# Patient Record
Sex: Male | Born: 1960 | Race: White | Hispanic: No | Marital: Married | Smoking: Current every day smoker
Health system: Southern US, Community
[De-identification: ages and names within clinical notes are randomized; demographics above are authoritative.]

## PROBLEM LIST (undated history)

## (undated) DIAGNOSIS — I2510 Atherosclerotic heart disease of native coronary artery without angina pectoris: Secondary | ICD-10-CM

## (undated) DIAGNOSIS — F32A Depression, unspecified: Secondary | ICD-10-CM

## (undated) DIAGNOSIS — K449 Diaphragmatic hernia without obstruction or gangrene: Secondary | ICD-10-CM

## (undated) DIAGNOSIS — F411 Generalized anxiety disorder: Secondary | ICD-10-CM

## (undated) DIAGNOSIS — E785 Hyperlipidemia, unspecified: Secondary | ICD-10-CM

## (undated) DIAGNOSIS — G4730 Sleep apnea, unspecified: Secondary | ICD-10-CM

## (undated) DIAGNOSIS — M1990 Unspecified osteoarthritis, unspecified site: Secondary | ICD-10-CM

## (undated) DIAGNOSIS — F329 Major depressive disorder, single episode, unspecified: Secondary | ICD-10-CM

## (undated) DIAGNOSIS — K219 Gastro-esophageal reflux disease without esophagitis: Secondary | ICD-10-CM

## (undated) HISTORY — DX: Hyperlipidemia, unspecified: E78.5

## (undated) HISTORY — PX: KNEE SURGERY: SHX244

## (undated) HISTORY — PX: COLONOSCOPY: SHX174

## (undated) HISTORY — DX: Depression, unspecified: F32.A

## (undated) HISTORY — DX: Major depressive disorder, single episode, unspecified: F32.9

## (undated) HISTORY — PX: MOUTH SURGERY: SHX715

## (undated) HISTORY — DX: Unspecified osteoarthritis, unspecified site: M19.90

## (undated) HISTORY — DX: Generalized anxiety disorder: F41.1

## (undated) HISTORY — DX: Sleep apnea, unspecified: G47.30

## (undated) HISTORY — DX: Gastro-esophageal reflux disease without esophagitis: K21.9

---

## 1998-04-09 ENCOUNTER — Ambulatory Visit (HOSPITAL_COMMUNITY): Admission: RE | Admit: 1998-04-09 | Discharge: 1998-04-09 | Payer: Self-pay | Admitting: Rheumatology

## 1998-05-31 ENCOUNTER — Ambulatory Visit (HOSPITAL_COMMUNITY): Admission: RE | Admit: 1998-05-31 | Discharge: 1998-05-31 | Payer: Self-pay | Admitting: Gastroenterology

## 1998-07-12 ENCOUNTER — Ambulatory Visit (HOSPITAL_COMMUNITY): Admission: RE | Admit: 1998-07-12 | Discharge: 1998-07-12 | Payer: Self-pay | Admitting: Gastroenterology

## 1998-10-16 ENCOUNTER — Encounter: Payer: Self-pay | Admitting: Gastroenterology

## 1998-10-16 ENCOUNTER — Ambulatory Visit (HOSPITAL_COMMUNITY): Admission: RE | Admit: 1998-10-16 | Discharge: 1998-10-16 | Payer: Self-pay | Admitting: Gastroenterology

## 1998-11-02 ENCOUNTER — Ambulatory Visit: Admission: RE | Admit: 1998-11-02 | Discharge: 1998-11-02 | Payer: Self-pay | Admitting: Gastroenterology

## 1998-11-02 ENCOUNTER — Encounter: Payer: Self-pay | Admitting: Gastroenterology

## 1999-09-13 ENCOUNTER — Emergency Department (HOSPITAL_COMMUNITY): Admission: EM | Admit: 1999-09-13 | Discharge: 1999-09-13 | Payer: Self-pay | Admitting: *Deleted

## 2007-02-09 ENCOUNTER — Ambulatory Visit (HOSPITAL_COMMUNITY): Admission: RE | Admit: 2007-02-09 | Discharge: 2007-02-09 | Payer: Self-pay | Admitting: Family Medicine

## 2008-08-04 ENCOUNTER — Emergency Department (HOSPITAL_COMMUNITY): Admission: EM | Admit: 2008-08-04 | Discharge: 2008-08-04 | Payer: Self-pay | Admitting: Emergency Medicine

## 2011-05-08 ENCOUNTER — Observation Stay (HOSPITAL_COMMUNITY)
Admission: EM | Admit: 2011-05-08 | Discharge: 2011-05-09 | Disposition: A | Discharge status: Home / Self Care | Payer: Self-pay | Attending: Emergency Medicine | Admitting: Emergency Medicine

## 2011-05-08 ENCOUNTER — Emergency Department (HOSPITAL_COMMUNITY): Payer: Self-pay

## 2011-05-08 DIAGNOSIS — R0989 Other specified symptoms and signs involving the circulatory and respiratory systems: Secondary | ICD-10-CM | POA: Insufficient documentation

## 2011-05-08 DIAGNOSIS — F17200 Nicotine dependence, unspecified, uncomplicated: Secondary | ICD-10-CM | POA: Insufficient documentation

## 2011-05-08 DIAGNOSIS — R0602 Shortness of breath: Secondary | ICD-10-CM | POA: Insufficient documentation

## 2011-05-08 DIAGNOSIS — R0609 Other forms of dyspnea: Secondary | ICD-10-CM | POA: Insufficient documentation

## 2011-05-08 DIAGNOSIS — R079 Chest pain, unspecified: Secondary | ICD-10-CM

## 2011-05-08 DIAGNOSIS — K449 Diaphragmatic hernia without obstruction or gangrene: Secondary | ICD-10-CM | POA: Insufficient documentation

## 2011-05-08 DIAGNOSIS — R110 Nausea: Secondary | ICD-10-CM | POA: Insufficient documentation

## 2011-05-08 DIAGNOSIS — I2510 Atherosclerotic heart disease of native coronary artery without angina pectoris: Secondary | ICD-10-CM | POA: Insufficient documentation

## 2011-05-08 HISTORY — DX: Diaphragmatic hernia without obstruction or gangrene: K44.9

## 2011-05-08 LAB — DIFFERENTIAL
Basophils Absolute: 0.1 10*3/uL (ref 0.0–0.1)
Basophils Relative: 1 % (ref 0–1)
Eosinophils Absolute: 0.1 10*3/uL (ref 0.0–0.7)
Eosinophils Relative: 2 % (ref 0–5)
Lymphocytes Relative: 21 % (ref 12–46)
Lymphs Abs: 1.4 10*3/uL (ref 0.7–4.0)
Monocytes Absolute: 0.2 10*3/uL (ref 0.1–1.0)
Monocytes Relative: 4 % (ref 3–12)
Neutro Abs: 4.8 10*3/uL (ref 1.7–7.7)
Neutrophils Relative %: 73 % (ref 43–77)

## 2011-05-08 LAB — CK TOTAL AND CKMB (NOT AT ARMC)
CK, MB: 1.3 ng/mL (ref 0.3–4.0)
Total CK: 56 U/L (ref 7–232)

## 2011-05-08 LAB — CBC
HCT: 41.5 % (ref 39.0–52.0)
Hemoglobin: 14.9 g/dL (ref 13.0–17.0)
WBC: 6.7 10*3/uL (ref 4.0–10.5)

## 2011-05-08 LAB — BASIC METABOLIC PANEL
Calcium: 10.9 mg/dL — ABNORMAL HIGH (ref 8.4–10.5)
GFR calc Af Amer: >60 mL/min (ref >60)
GFR calc non Af Amer: >60 mL/min (ref >60)
Sodium: 137 mEq/L (ref 135–145)

## 2011-05-08 LAB — POCT CARDIAC MARKERS
CKMB, poc: <1 ng/mL — ABNORMAL LOW (ref 1.0–8.0)
Myoglobin, poc: 84.9 ng/mL (ref 12–200)

## 2011-05-09 ENCOUNTER — Encounter (HOSPITAL_COMMUNITY): Payer: Self-pay | Admitting: Radiology

## 2011-05-09 ENCOUNTER — Observation Stay (HOSPITAL_COMMUNITY): Payer: Self-pay

## 2011-05-09 MED ORDER — IOHEXOL 350 MG/ML SOLN
80.0000 mL | Freq: Once | INTRAVENOUS | Status: AC | PRN
  Administered 2011-05-09: 80 mL via INTRAVENOUS

## 2011-05-11 ENCOUNTER — Telehealth: Payer: Self-pay | Admitting: Physician Assistant

## 2011-05-11 NOTE — H&P (Signed)
NAMEMARIUSZ, [name] NO.:  [id number]  MEDICAL RECORD NO.:  [id number]           PATIENT TYPE:  O  LOCATION:  1864                         FACILITY:  [facility]  PHYSICIAN:  Verne Carrow, MDDATE OF BIRTH:  Aug 29, 1961  DATE OF ADMISSION:  05/08/2011 DATE OF DISCHARGE:                             HISTORY & PHYSICAL   REASON FOR ADMISSION:  Chest pain.  HISTORY OF PRESENT ILLNESS:  Mr. [name] is a pleasant 50 year old Caucasian male with a history of tobacco abuse, who presented to the emergency department at the advice of his primary care physician after 4 days of dull chest pain, nausea, and a sensation of heaviness in his head and extremities.  Yesterday morning, the patient noted a worsening of the pain as well as blurry vision and had an episode of nausea with emesis.  He does describe dyspnea on exertion for 1 year.  He was seen by his primary care doctor and was noted to have a high blood pressure and was advised to go to the emergency department.  He has had no further episodes of chest pain while here in the emergency department. The patient has been watched in the clinical decision unit.  The patient underwent a coronary CT scan earlier today which showed the possibility of calcified plaque in all 3 of his major epicardial coronary arteries. At this time, the patient has no complaints.  PAST MEDICAL HISTORY: 1. Ongoing tobacco abuse. 2. Hiatal hernia.  PAST SURGICAL HISTORY:  Knee surgery.  ALLERGIES:  No known drug allergies.  HOME MEDICATIONS:  None.  SOCIAL HISTORY:  The patient lives in [place] with his wife.  He is married with 2 adult children.  He is employed as a Gaffer.  He denies use of alcohol or illicit drugs.  He has smoked approximately 40 pack years.  He currently continues to smoke.  FAMILY HISTORY:  The patient's mother has had a leaky valve, however, this is not further specified.  His father died at age 51 from  a myocardial infarction.  REVIEW OF SYSTEMS:  As stated in the history of present illness, is otherwise negative.  PHYSICAL EXAMINATION:  VITAL SIGNS:  Temperature 99.8, pulse 76 and regular, respirations 18 and unlabored, blood pressure 149/90, he is sating 98% on room air. GENERAL:  He is a pleasant, well-nourished, middle-aged Caucasian male in no acute distress.  He is alert and oriented x3. HEENT:  Normal. NECK:  No JVD.  No carotid bruits.  No thyromegaly.  No lymphadenopathy. SKIN:  Warm and dry. MUSCULOSKELETAL:  Moves all extremities equally. NEUROLOGICAL:  No focal neurological deficits. PSYCHIATRIC:  Mood and affect are appropriate. CARDIOVASCULAR:  Regular rate and rhythm without murmurs, gallops, or rubs noted. ABDOMEN:  Soft, nontender.  Bowel sounds are present. LUNGS:  Clear to auscultation bilaterally without wheezes, rhonchi, or crackles noted. EXTREMITIES:  No evidence of edema.  Pulses are 2+ in all extremities.  DIAGNOSTIC STUDIES: 1. A 12-lead EKG shows normal sinus rhythm with no ST-segment changes. 2. Cardiac enzymes have been negative x3. 3. Hemoglobin 14.9, creatinine 0.93. 4. Coronary CTA shows calcified plaque in the  left anterior descending     artery, diagonal branches of the LAD, circumflex artery and the     right coronary artery.  ASSESSMENT AND PLAN:  This is a pleasant 50 year old Caucasian male who was admitted to the hospital with chest pain, dyspnea, nausea, and vomiting.  He is ruled out for myocardial infarction with serial cardiac enzymes.  There are no ischemic changes on his EKG.  He has been watched here in the clinical decision unit and a cardiac CT this afternoon suggested the possibility of calcified stenoses in the major epicardial coronary arteries.  I have discussed the potential for obstructive coronary artery disease with this patient and his family.  He agrees to proceed with the diagnostic left heart catheterization today.   All risks and benefits of the procedure have been reviewed.  We will plan his cath in the main cath lab this afternoon.  He will be given 4 additional baby aspirin today.     Verne Carrow, MD     CM/MEDQ  D:  05/09/2011  T:  05/10/2011  Job:  [id number]  Electronically Signed by Verne Carrow MD on 05/11/2011 09:57:52 PM

## 2011-05-11 NOTE — Telephone Encounter (Signed)
Patient seen in ED on Friday 5/18 with chest pain.  CT of chest notable for coronary calcifications, esp in the LAD territory. He was to have a cath, but it was postponed and he was sent home with plans to do his cath tomorrow. He now calls with "panic attacks" and wants to know if he can take his mother's lorazepam. He is waking with chest pressure and dyspnea.  He notes radiation up to his jaw.  He can belch and the symptoms improve. With his CT demonstrating evidence for CAD and his symptoms, I have recommended he go to the ED at [facility] for admission with plans to do his cath tomorrow as scheduled.

## 2011-05-12 ENCOUNTER — Ambulatory Visit (HOSPITAL_COMMUNITY)
Admission: RE | Admit: 2011-05-12 | Discharge: 2011-05-12 | Disposition: A | Discharge status: Home / Self Care | Payer: Self-pay | Source: Ambulatory Visit | Attending: Cardiovascular Disease | Admitting: Cardiovascular Disease

## 2011-05-12 DIAGNOSIS — R0789 Other chest pain: Secondary | ICD-10-CM | POA: Insufficient documentation

## 2011-05-12 DIAGNOSIS — I2510 Atherosclerotic heart disease of native coronary artery without angina pectoris: Secondary | ICD-10-CM | POA: Insufficient documentation

## 2011-05-12 LAB — PROTIME-INR: INR: 0.95 (ref 0.00–1.49)

## 2011-05-12 LAB — BASIC METABOLIC PANEL
Chloride: 101 mEq/L (ref 96–112)
Creatinine, Ser: 0.9 mg/dL (ref 0.4–1.5)
GFR calc Af Amer: >60 mL/min (ref >60)
Potassium: 3.9 mEq/L (ref 3.5–5.1)

## 2011-05-12 NOTE — Telephone Encounter (Signed)
Agree. cdm 

## 2011-05-13 ENCOUNTER — Telehealth: Payer: Self-pay | Admitting: Cardiovascular Disease

## 2011-05-13 DIAGNOSIS — K449 Diaphragmatic hernia without obstruction or gangrene: Secondary | ICD-10-CM

## 2011-05-13 NOTE — Telephone Encounter (Signed)
Patient was discharged yesterday after his cardiac catheterization. His cath was clean and Dr. Clifton James does not believe this is CAD. He has been having severe anxiety attacks along with SOB for the past few weeks due to his hiata hernia and would like to be seen by a gastroenterologist ASAP. He hasn't been able to eat for the past few weeks without getting SOB and has been spitting up a yellow substance as well. We will refer him to see [facility] as soon as possible.

## 2011-05-13 NOTE — Telephone Encounter (Signed)
Pt had cath done on yesterday.  C./ O pressure on chest. Sob. Severe ingestion.  H/O hiatal  hernia.

## 2011-05-13 NOTE — Cardiovascular Report (Signed)
[name], [name] NO.:  [id number]  MEDICAL RECORD NO.:  [id number]           PATIENT TYPE:  O  LOCATION:  [facility]                         FACILITY:  [facility]  PHYSICIAN:  Verne Carrow, MDDATE OF BIRTH:  08/30/1961  DATE OF PROCEDURE:  05/12/2011 DATE OF DISCHARGE:                           CARDIAC CATHETERIZATION   PROCEDURE PERFORMED: 1. Left heart catheterization 2. Selective coronary angiography. 3. Left ventricular angiogram.  OPERATOR:  Verne Carrow, MD  INDICATIONS:  This is a 50 year old Caucasian male with a history of ongoing tobacco abuse who presented to the emergency department last week with complaints of chest discomfort.  He underwent a coronary CT scan which showed calcification in his left anterior descending artery and circumflex artery with moderate stenoses.  The patient's chest pain was somewhat atypical, however, diagnostic catheterization was arranged given the findings on the coronary CT scan.  We had originally planned on performing his catheterization on Friday, May 18, however, due to scheduling issues, we arranged it for today.  PROCEDURE IN DETAIL:  The patient was brought to the main cardiac catheterization laboratory as an outpatient.  Informed consent was signed prior to the procedure.  The patient was placed supine on the cath table.  An Allen's test was performed on the right wrist and was positive.  The right wrist was then prepped and draped in sterile fashion.  A 1% lidocaine was used for local anesthesia.  A 5-French sheath was inserted without difficulty into the right radial artery.  A 3 mg of verapamil was given through the sheath.  A 4000 units of intravenous heparin was given after sheath insertion.  Initially we used a JL-3.5 catheter to engage the left system.  This catheter was selectively engaged into the circumflex artery.  We performed angiography of the circumflex artery, then exchanged the  catheter out for a TIG catheters.  We were then able to cannulate the left main appropriately and visualize both the left anterior descending artery and the circumflex artery with these injections.  A JR-4 catheter was used to selectively engage and inject the small nondominant right coronary artery.  A pigtail catheter was used to perform a left ventricular angiogram.  The patient tolerated the procedure well without any complications.  The patient had the sheath removed here in the cath lab and a Terumo hemostasis band was applied over the arteriotomy site.  The patient was taken to the recovery area in stable condition.  HEMODYNAMIC FINDINGS:  Central aortic pressure 120/73.  Left ventricular pressure 129/0/15.  ANGIOGRAPHIC FINDINGS: 1. The left main coronary artery had no evidence of disease. 2. The left anterior descending was a moderate-sized vessel that     coursed to the apex.  The proximal vessel had mild calcification     with mild 20% plaque.  The midvessel had 20% plaque.  The midvessel     had one area with a tubular 40% stenosis.  None of these lesions     appeared to be flow-limiting.  There were too small to moderate-     sized diagonal branches that had mild plaque disease. 3. The circumflex  artery gave off 2 early small-caliber obtuse     marginal branches and a large bifurcating third obtuse marginal     branch.  There was mild plaque disease in this vessel.  The third     obtuse marginal branch had a 40% stenosis that did not appear to be     flow-limiting. 4. The right coronary artery was a small nondominant vessel with no     evidence of disease. 5. Left ventricular angiogram was performed in the RAO projection     showed normal left ventricular systolic function with ejection     fraction of 55-60%.  IMPRESSION: 1. Moderate nonobstructive coronary artery disease. 2. Normal left ventricular systolic function. 3. Noncardiac chest pain, most likely secondary  to GI source.  RECOMMENDATIONS:  I do not feel this patient's moderate nonobstructive coronary artery disease is causing his chest pain.  His pain occurs most commonly after meals.  I will start him on a proton pump inhibitor as well as an aspirin and statin.  We will discharge him home today and will plan followup in 2-3 weeks in my office.  At the office visit, we will discuss referral to a gastroenterologist for upper endoscopy.  The patient does have a history of hiatal hernia.     Verne Carrow, MD     CM/MEDQ  D:  05/12/2011  T:  05/12/2011  Job:  [id number]  Electronically Signed by Verne Carrow MD on 05/13/2011 08:38:13 AM

## 2011-05-15 ENCOUNTER — Encounter: Payer: Self-pay | Admitting: Gastroenterology

## 2011-05-15 ENCOUNTER — Ambulatory Visit (INDEPENDENT_AMBULATORY_CARE_PROVIDER_SITE_OTHER): Payer: Self-pay | Admitting: Gastroenterology

## 2011-05-15 DIAGNOSIS — F419 Anxiety disorder, unspecified: Secondary | ICD-10-CM | POA: Insufficient documentation

## 2011-05-15 DIAGNOSIS — R079 Chest pain, unspecified: Secondary | ICD-10-CM

## 2011-05-15 DIAGNOSIS — F411 Generalized anxiety disorder: Secondary | ICD-10-CM

## 2011-05-15 DIAGNOSIS — K449 Diaphragmatic hernia without obstruction or gangrene: Secondary | ICD-10-CM

## 2011-05-15 DIAGNOSIS — R1310 Dysphagia, unspecified: Secondary | ICD-10-CM

## 2011-05-15 MED ORDER — ALPRAZOLAM 1 MG PO TABS: ORAL_TABLET | ORAL | Status: DC

## 2011-05-15 NOTE — Assessment & Plan Note (Signed)
Symptoms are very suggestive of a large hilar hernia that may reside mostly in his chest. An esophageal motility disorder or fixed esophageal stricture are other possibilities.  Recommendations #1 upper GI series

## 2011-05-15 NOTE — Patient Instructions (Addendum)
You have been scheduled for a Upper GI Series at [facility]. Please arrive on 05/21/11 at 845 am Nothing to eat or drink after midnight.

## 2011-05-15 NOTE — Assessment & Plan Note (Addendum)
Per the patient's request I will prescribe an anxieolytic

## 2011-05-15 NOTE — Progress Notes (Signed)
History of Present Illness:  [name] is a pleasant 50 year old white male referred at the request of Dr. Garner Nash for evaluation of dysphagia. Recently he has had problems consisting of severe fullness postprandially in the chest. He has had to vomit to relieve this pressure. It is not quite clear to me whether he is having dysphagia, per se. An attempt at upper endoscopy several years ago was unsuccessful due to poor sedation. He's lost about 10 pounds in the past few months. He believes his symptoms are worsening. With this fullness he may develop a panic attack. He has frequent pyrosis. Upper GI series in 1999 demonstrated a sliding hiatal hernia. Cardiac catheterization last week demonstrated moderate coronary artery disease.   Review of Systems: He has occasional discomfort over the left flank. He denies dysuria. Pertinent positive and negative review of systems were noted in the above HPI section. All other review of systems were otherwise negative.    Current Medications, Allergies, Past Medical History, Past Surgical History, Family History and Social History were reviewed in [employer] electronic medical record  Vital signs were reviewed in today's medical record. Physical Exam: General: Well developed , well nourished, no acute distress Head: Normocephalic and atraumatic Eyes:  sclerae anicteric, EOMI Ears: Normal auditory acuity Mouth: No deformity or lesions Lungs: Clear throughout to auscultation Heart: Regular rate and rhythm; no murmurs, rubs or bruits Abdomen: Soft, and non distended. No masses, hepatosplenomegaly or hernias noted. Normal Bowel sounds; there is minimal tenderness to palpation in the subxiphoid area Rectal:deferred Musculoskeletal: Symmetrical with no gross deformities  Pulses:  Normal pulses noted Extremities: No clubbing, cyanosis, edema or deformities noted Neurological: Alert oriented x 4, grossly nonfocal Psychological:  Alert and cooperative.  Normal mood and affect

## 2011-05-20 ENCOUNTER — Encounter: Payer: Self-pay | Admitting: Cardiovascular Disease

## 2011-05-21 ENCOUNTER — Ambulatory Visit (HOSPITAL_COMMUNITY)
Admission: RE | Admit: 2011-05-21 | Discharge: 2011-05-21 | Disposition: A | Discharge status: Home / Self Care | Payer: Self-pay | Source: Ambulatory Visit | Attending: Gastroenterology | Admitting: Gastroenterology

## 2011-05-21 DIAGNOSIS — R079 Chest pain, unspecified: Secondary | ICD-10-CM | POA: Insufficient documentation

## 2011-05-21 DIAGNOSIS — R142 Eructation: Secondary | ICD-10-CM | POA: Insufficient documentation

## 2011-05-21 DIAGNOSIS — R141 Gas pain: Secondary | ICD-10-CM | POA: Insufficient documentation

## 2011-05-21 DIAGNOSIS — K449 Diaphragmatic hernia without obstruction or gangrene: Secondary | ICD-10-CM | POA: Insufficient documentation

## 2011-05-21 DIAGNOSIS — R1310 Dysphagia, unspecified: Secondary | ICD-10-CM | POA: Insufficient documentation

## 2011-05-21 DIAGNOSIS — R143 Flatulence: Secondary | ICD-10-CM | POA: Insufficient documentation

## 2011-05-21 NOTE — Progress Notes (Signed)
Quick Note:  UGI shows a small hiatal hernia only. Plan EGD with dilitation for possible early stricture causing symptoms ______

## 2011-05-23 ENCOUNTER — Telehealth: Payer: Self-pay | Admitting: Gastroenterology

## 2011-05-23 NOTE — Telephone Encounter (Signed)
Results given to pt per Dr. Arlyce Dice. Pt scheduled for EGD with dil for 05/30/11@4pm . Previsit scheduled for 05/26/11@10 :30am. Pt aware of appt dates and times.

## 2011-05-26 ENCOUNTER — Ambulatory Visit (AMBULATORY_SURGERY_CENTER): Payer: Self-pay | Admitting: *Deleted

## 2011-05-26 VITALS — Ht 68.0 in | Wt 158.6 lb

## 2011-05-26 DIAGNOSIS — K449 Diaphragmatic hernia without obstruction or gangrene: Secondary | ICD-10-CM

## 2011-05-26 DIAGNOSIS — R1310 Dysphagia, unspecified: Secondary | ICD-10-CM

## 2011-05-27 ENCOUNTER — Encounter: Payer: Self-pay | Admitting: Cardiovascular Disease

## 2011-05-27 ENCOUNTER — Ambulatory Visit (INDEPENDENT_AMBULATORY_CARE_PROVIDER_SITE_OTHER): Payer: Self-pay | Admitting: Cardiovascular Disease

## 2011-05-27 VITALS — BP 120/70 | HR 88 | Resp 14 | Ht 68.0 in | Wt 157.0 lb

## 2011-05-27 DIAGNOSIS — I2510 Atherosclerotic heart disease of native coronary artery without angina pectoris: Secondary | ICD-10-CM | POA: Insufficient documentation

## 2011-05-27 NOTE — Progress Notes (Signed)
History of Present Illness:50 yo WM with history of tobacco abuse and hiatal hernia as well as recently diagnosed CAD. He was seen in the ED at [facility] on 04/22/11 with CP and ruled out for an MI. I saw him and let him go home. Outpatient cath on 05/12/11 with moderate non-obstructive disease and normal LV function. His chest pain was felt to possibly be GI related. He has been seen by Dr. Arlyce Dice with GI and had a barium swallow which showed a small hiatal hernia. Plans are in place for an upper endoscopy.   He is feeling well today. Occasional chest pain, mild. No SOB. He is only smoking 1/2 ppd. He is trying to quit completely.   Past Medical History  Diagnosis Date  . Hiatal hernia   . Anxiety state, unspecified   . Hyperlipemia   . Depression   . GERD (gastroesophageal reflux disease)   . Hemorrhoids   . Arthritis   . Sleep apnea     Past Surgical History  Procedure Date  . Knee surgery     left  . Mouth surgery   . Colonoscopy     Current Outpatient Prescriptions  Medication Sig Dispense Refill  . ALPRAZolam (XANAX) 1 MG tablet Take one tab every 8 hours as needed for anxiety  30 tablet  0  . aspirin 81 MG tablet Take 81 mg by mouth daily.        . Oxymetazoline HCl (NASAL SPRAY NA) by Nasal route. As needed       . pantoprazole (PROTONIX) 40 MG tablet Take 40 mg by mouth daily.        . simvastatin (ZOCOR) 20 MG tablet Take 20 mg by mouth at bedtime.          No Known Allergies  History   Social History  . Marital Status: Married    Spouse Name: N/A    Number of Children: 2  . Years of Education: N/A   Occupational History  . [employer]     Social History Main Topics  . Smoking status: Current Everyday Smoker -- 0.8 packs/day  . Smokeless tobacco: Never Used  . Alcohol Use: No  . Drug Use: No  . Sexually Active: Not on file   Other Topics Concern  . Not on file   Social History Narrative  . No narrative on file    Family History  Problem Relation Age  of Onset  . Colon cancer Neg Hx   . Stomach cancer Maternal Grandmother   . Colon polyps Father   . Ulcerative colitis Father     ?  . Diabetes Father   . Heart disease Father     Review of Systems:  As stated in the HPI and otherwise negative.   BP 120/70  Pulse 88  Resp 14  Ht 5\' 8"  (1.727 m)  Wt 157 lb (71.215 kg)  BMI 23.87 kg/m2  Physical Examination: General: Well developed, well nourished, NAD HEENT: OP clear, mucus membranes moist SKIN: warm, dry. No rashes. Neuro: No focal deficits Musculoskeletal: Muscle strength 5/5 all ext Psychiatric: Mood and affect normal Neck: No JVD, no carotid bruits, no thyromegaly, no lymphadenopathy. Lungs:Clear bilaterally, no wheezes, rhonci, crackles Cardiovascular: Regular rate and rhythm. No murmurs, gallops or rubs. Abdomen:Soft. Bowel sounds present. Non-tender.  Extremities: No lower extremity edema. Pulses are 2 + in the bilateral DP/PT.  Cardiac Cath 05/12/11:1. The left main coronary artery had no evidence of disease.   2.  The left anterior descending was a moderate-sized vessel that       coursed to the apex.  The proximal vessel had mild calcification       with mild 20% plaque.  The midvessel had 20% plaque.  The midvessel       had one area with a tubular 40% stenosis.  None of these lesions       appeared to be flow-limiting.  There were too small to moderate-       sized diagonal branches that had mild plaque disease.   3. The circumflex artery gave off 2 early small-caliber obtuse       marginal branches and a large bifurcating third obtuse marginal       branch.  There was mild plaque disease in this vessel.  The third       obtuse marginal branch had a 40% stenosis that did not appear to be       flow-limiting.   4. The right coronary artery was a small nondominant vessel with no       evidence of disease.   5. Left ventricular angiogram was performed in the RAO projection       showed normal left ventricular  systolic function with ejection       fraction of 55-60%.

## 2011-05-27 NOTE — Assessment & Plan Note (Signed)
He has mild to moderate non-obstructive disease by cath 05/12/11. Continue statin and ASA. He is encouraged to stop smoking. Will check lipids and LFTs at f/u visit in 6 months. (AM appt if possible).

## 2011-05-30 ENCOUNTER — Other Ambulatory Visit: Payer: Self-pay | Admitting: Gastroenterology

## 2011-06-05 ENCOUNTER — Other Ambulatory Visit: Payer: Self-pay | Admitting: Gastroenterology

## 2011-06-05 ENCOUNTER — Ambulatory Visit (AMBULATORY_SURGERY_CENTER): Payer: Self-pay | Admitting: Gastroenterology

## 2011-06-05 ENCOUNTER — Encounter: Payer: Self-pay | Admitting: Gastroenterology

## 2011-06-05 DIAGNOSIS — R1310 Dysphagia, unspecified: Secondary | ICD-10-CM

## 2011-06-05 DIAGNOSIS — R079 Chest pain, unspecified: Secondary | ICD-10-CM

## 2011-06-05 MED ORDER — SODIUM CHLORIDE 0.9 % IV SOLN: 500.0000 mL | INTRAVENOUS | Status: AC

## 2011-06-05 NOTE — Patient Instructions (Addendum)
Resume all medications.The office will schedule Gastric Emptying Scan. Call office to schedule follow up appointment for 2-3 weeks. Dilation diet given.

## 2011-06-06 ENCOUNTER — Telehealth: Payer: Self-pay

## 2011-06-06 ENCOUNTER — Telehealth: Payer: Self-pay | Admitting: *Deleted

## 2011-06-06 ENCOUNTER — Other Ambulatory Visit: Payer: Self-pay | Admitting: Gastroenterology

## 2011-06-06 ENCOUNTER — Emergency Department (HOSPITAL_COMMUNITY): Payer: Self-pay

## 2011-06-06 ENCOUNTER — Emergency Department (HOSPITAL_COMMUNITY)
Admission: EM | Admit: 2011-06-06 | Discharge: 2011-06-06 | Disposition: A | Discharge status: Home / Self Care | Payer: Self-pay | Attending: Emergency Medicine | Admitting: Emergency Medicine

## 2011-06-06 DIAGNOSIS — R52 Pain, unspecified: Secondary | ICD-10-CM

## 2011-06-06 DIAGNOSIS — R079 Chest pain, unspecified: Secondary | ICD-10-CM | POA: Insufficient documentation

## 2011-06-06 DIAGNOSIS — R1013 Epigastric pain: Secondary | ICD-10-CM | POA: Insufficient documentation

## 2011-06-06 DIAGNOSIS — K224 Dyskinesia of esophagus: Secondary | ICD-10-CM | POA: Insufficient documentation

## 2011-06-06 DIAGNOSIS — R0989 Other specified symptoms and signs involving the circulatory and respiratory systems: Secondary | ICD-10-CM | POA: Insufficient documentation

## 2011-06-06 DIAGNOSIS — R0609 Other forms of dyspnea: Secondary | ICD-10-CM | POA: Insufficient documentation

## 2011-06-06 LAB — DIFFERENTIAL
Basophils Relative: 1 % (ref 0–1)
Lymphs Abs: 1.6 10*3/uL (ref 0.7–4.0)
Monocytes Absolute: 0.3 10*3/uL (ref 0.1–1.0)
Monocytes Relative: 6 % (ref 3–12)
Neutro Abs: 2.8 10*3/uL (ref 1.7–7.7)

## 2011-06-06 LAB — COMPREHENSIVE METABOLIC PANEL
ALT: 10 U/L (ref 0–53)
Albumin: 3.7 g/dL (ref 3.5–5.2)
Alkaline Phosphatase: 47 U/L (ref 39–117)
GFR calc Af Amer: >60 mL/min (ref >60)
Glucose, Bld: 97 mg/dL (ref 70–99)
Potassium: 3.8 mEq/L (ref 3.5–5.1)
Sodium: 138 mEq/L (ref 135–145)
Total Protein: 6.8 g/dL (ref 6.0–8.3)

## 2011-06-06 LAB — CBC
Hemoglobin: 12.6 g/dL — ABNORMAL LOW (ref 13.0–17.0)
MCHC: 34 g/dL (ref 30.0–36.0)

## 2011-06-06 NOTE — Telephone Encounter (Signed)
Pt scheduled for GES at [facility] 06/30/11 arrival time at 9:45am, scan time 10am. Pt to be NPO after midnight and hold protonix for 24 hours prior to exam . Pt aware of appt date and time.

## 2011-06-06 NOTE — Telephone Encounter (Signed)
Received call from Rosalita Chessman, RN from [facility]. Per Rosalita Chessman Dr. Arlyce Dice requests a stat chest xray, abdominal xray and gastric swallow on pt. She requests that the orders be put in computer.   Orders were put in the computer and Rosalita Chessman called back and stated that Dr. Arlyce Dice now only wanted a chest xray. Additional orders deleted.

## 2011-06-06 NOTE — Telephone Encounter (Signed)
I called the patient back regarding the chest x-ray, and now he states that he has also had shortness of breath for most of the time.  At  0813, I paged Dr. Arlyce Dice again for further instructions. 1610 Paged Dr. Arlyce Dice again for further orders.   [id number]  Dr. Arlyce Dice wants to the patient to go to the ER for evaluation.   Called patient and he is on his way from [place].

## 2011-06-06 NOTE — Telephone Encounter (Signed)
Follow up Call- Patient questions:  Do you have a fever, pain , or abdominal swelling? yes Pain Score  6 *  Have you tolerated food without any problems? yes  Have you been able to return to your normal activities? no  Do you have any questions about your discharge instructions: Diet   yes Medications  no Follow up visit  no  Do you have questions or concerns about your Care? yes  Actions: * If pain score is 4 or above: Physician/ provider Notified : Melvia Heaps, MD.  Dr. Arlyce Dice notified, and orders received for a chest x-ray, abd x-ray and A gastric swallow stat; Bonita Quin notified and she will key in the orders; results to be called to Dr. Arlyce Dice.  Pt. Called to go to [facility].

## 2011-06-20 ENCOUNTER — Ambulatory Visit: Payer: Self-pay | Admitting: Gastroenterology

## 2011-06-30 ENCOUNTER — Encounter (HOSPITAL_COMMUNITY): Payer: Self-pay

## 2011-06-30 ENCOUNTER — Telehealth: Payer: Self-pay | Admitting: Gastroenterology

## 2011-06-30 ENCOUNTER — Encounter (HOSPITAL_COMMUNITY)
Admission: RE | Admit: 2011-06-30 | Discharge: 2011-06-30 | Disposition: A | Discharge status: Home / Self Care | Payer: Self-pay | Source: Ambulatory Visit | Attending: Gastroenterology | Admitting: Gastroenterology

## 2011-06-30 DIAGNOSIS — K3189 Other diseases of stomach and duodenum: Secondary | ICD-10-CM | POA: Insufficient documentation

## 2011-06-30 DIAGNOSIS — F419 Anxiety disorder, unspecified: Secondary | ICD-10-CM

## 2011-06-30 DIAGNOSIS — R109 Unspecified abdominal pain: Secondary | ICD-10-CM | POA: Insufficient documentation

## 2011-06-30 DIAGNOSIS — R1013 Epigastric pain: Secondary | ICD-10-CM | POA: Insufficient documentation

## 2011-06-30 MED ORDER — TECHNETIUM TC 99M SULFUR COLLOID
2.2000 | Freq: Once | INTRAVENOUS | Status: AC | PRN
  Administered 2011-06-30: 2.2 via ORAL

## 2011-06-30 MED ORDER — ALPRAZOLAM 1 MG PO TABS: ORAL_TABLET | ORAL | Status: DC

## 2011-06-30 NOTE — Progress Notes (Signed)
Quick Note:  Pt has delayed gastric emptying c/w gastroparesis Begin reglan 10mg ac and hs Instruct pt to contact me immediately if she develops any side effects from her Reglan including paresthesias, tremors, confusion , weakness or muscle spasms. OV 1 month  ______ 

## 2011-06-30 NOTE — Telephone Encounter (Signed)
Spoke with Dr. Arlyce Dice and patient had previously had Xanax. Okay to refill this x 1 then have his PCP to order. Also patient given GES results and Reglan rx sent to pharmacy as per Dr. Arlyce Dice. Patient notified and he would like rx sent to [facility] pharmacy for Reglan. He will pick up the Xanax rx. Patient already has an OV scheduled on 07/08/11.

## 2011-06-30 NOTE — Telephone Encounter (Signed)
Patient states every time he eats and sometimes even when he is just sitting around, he gets a full feeling and he has a panic attack and feels like he cannot breathe. States he went for his test this AM and when he had to eat the food, he had an attack. He wants something for anxiety. Please, advise.

## 2011-06-30 NOTE — Telephone Encounter (Signed)
He had been taking ativan.  If he doesn't have a current prescription begin 1mg  q6h prn. #25.  He needs to f/u with his PCP

## 2011-07-01 ENCOUNTER — Telehealth: Payer: Self-pay | Admitting: *Deleted

## 2011-07-01 MED ORDER — METOCLOPRAMIDE HCL 10 MG PO TABS: 10.0000 mg | ORAL_TABLET | Freq: Three times a day (TID) | ORAL | Status: DC

## 2011-07-01 MED ORDER — METOCLOPRAMIDE HCL 10 MG PO TABS: ORAL_TABLET | ORAL | Status: DC

## 2011-07-01 NOTE — Telephone Encounter (Signed)
Addended by: Daphine Deutscher on: 07/01/2011 08:09 AM   Modules accepted: Orders

## 2011-07-01 NOTE — Telephone Encounter (Signed)
Called pharmacy and told them to fill the second Reglan rx that  Has ths sig: take one ac and hs.

## 2011-07-02 ENCOUNTER — Telehealth: Payer: Self-pay | Admitting: Cardiovascular Disease

## 2011-07-02 ENCOUNTER — Telehealth: Payer: Self-pay | Admitting: Gastroenterology

## 2011-07-02 NOTE — Telephone Encounter (Signed)
Pt given rx metoclopramide 10 mg , known to cause rapid heart rate,and wants to know if ok to take?

## 2011-07-02 NOTE — Telephone Encounter (Signed)
Pt prescribed metoclopramide by Dr. Arlyce Dice. I told him it would be OK for him to take.

## 2011-07-02 NOTE — Telephone Encounter (Signed)
Patient wants to know if he should continue his Protonix. Patient told to continue this.

## 2011-07-08 ENCOUNTER — Encounter: Payer: Self-pay | Admitting: Gastroenterology

## 2011-07-08 ENCOUNTER — Ambulatory Visit (INDEPENDENT_AMBULATORY_CARE_PROVIDER_SITE_OTHER): Payer: Self-pay | Admitting: Gastroenterology

## 2011-07-08 VITALS — BP 102/70 | HR 60 | Ht 68.0 in | Wt 163.6 lb

## 2011-07-08 DIAGNOSIS — F419 Anxiety disorder, unspecified: Secondary | ICD-10-CM

## 2011-07-08 DIAGNOSIS — F411 Generalized anxiety disorder: Secondary | ICD-10-CM

## 2011-07-08 DIAGNOSIS — K3184 Gastroparesis: Secondary | ICD-10-CM

## 2011-07-08 NOTE — Assessment & Plan Note (Addendum)
Patient;s symptoms are likely related to gastroparesis. He is intolerant of Reglan.  Medications #1   Trial of domperidone 10 mg one half hour a.c. and at bedtime (cannot use erythromycin in the face of therapy with Zocor)

## 2011-07-08 NOTE — Patient Instructions (Signed)
We will start you on a new prescription that will be sent to your pharmacy Call us back in one week

## 2011-07-08 NOTE — Progress Notes (Signed)
History of Present Illness:  Mr. [name]  Has returned following upper endoscopy. The exam was normal. A gastric emptying scan demonstrated gastroparesis.   He took Reglan for 3 days but had to discontinue this because of shakiness and jitteriness. He continues to complain of postprandial fullness.    Review of Systems: Pertinent positive and negative review of systems were noted in the above HPI section. All other review of systems were otherwise negative.    Current Medications, Allergies, Past Medical History, Past Surgical History, Family History and Social History were reviewed in [employer] electronic medical record  Vital signs were reviewed in today's medical record. Physical Exam: General: Well developed , well nourished, no acute distress Head: Normocephalic and atraumatic

## 2011-07-08 NOTE — Assessment & Plan Note (Signed)
The patient was advised to follow up with his PCP since this is an ongoing problem

## 2011-07-30 ENCOUNTER — Telehealth: Payer: Self-pay | Admitting: Gastroenterology

## 2011-07-30 NOTE — Telephone Encounter (Signed)
Spoke with patient and gave him Dr. Regino Schultze recommendations. Reviewed gastroparesis diet with patient and mailed him the diet.

## 2011-07-30 NOTE — Telephone Encounter (Signed)
We are running out of options here. Please increase Protonix to bid x 2 weeks and start on strict gastroparesis diet with small feedings 4-6x/day, low roughage especially  the evening meals..You may mail him gastroparesis diet.

## 2011-07-30 NOTE — Telephone Encounter (Signed)
Dr. Arlyce Dice pt states the domperidone is not helping. Reports he is having a lot of abdominal cramps and has no appetite because the "food keeps backing up." Pt cannot take Erythromycin due to taking zocor, he was intolerant of Reglan. Dr. Juanda Chance as doc of the day please advise.

## 2011-09-05 ENCOUNTER — Telehealth: Payer: Self-pay | Admitting: *Deleted

## 2011-09-05 NOTE — Telephone Encounter (Signed)
The patient called in on my direct line today stating that he has been having panic attacks recently. His bp was 155/113 last night and wanted Dr. Clifton James to prescribe something for his nerves. I made him aware that Dr. Clifton James will not do this and that he needs to establish with a PCP since he does not have one, or go to Urgent Care. He voices understanding.

## 2011-11-25 ENCOUNTER — Ambulatory Visit: Payer: Self-pay | Admitting: Cardiovascular Disease

## 2011-11-26 ENCOUNTER — Encounter: Payer: Self-pay | Admitting: Cardiovascular Disease

## 2011-11-27 ENCOUNTER — Encounter: Payer: Self-pay | Admitting: Cardiovascular Disease

## 2011-11-27 ENCOUNTER — Ambulatory Visit (INDEPENDENT_AMBULATORY_CARE_PROVIDER_SITE_OTHER): Payer: Self-pay | Admitting: Cardiovascular Disease

## 2011-11-27 VITALS — BP 116/70 | HR 80 | Ht 68.0 in | Wt 161.4 lb

## 2011-11-27 DIAGNOSIS — F17200 Nicotine dependence, unspecified, uncomplicated: Secondary | ICD-10-CM

## 2011-11-27 DIAGNOSIS — I2510 Atherosclerotic heart disease of native coronary artery without angina pectoris: Secondary | ICD-10-CM

## 2011-11-27 DIAGNOSIS — Z720 Tobacco use: Secondary | ICD-10-CM | POA: Insufficient documentation

## 2011-11-27 MED ORDER — SIMVASTATIN 20 MG PO TABS: 20.0000 mg | ORAL_TABLET | Freq: Every day | ORAL | Status: DC

## 2011-11-27 NOTE — Progress Notes (Signed)
History of Present Illness: 50 yo WM with history of moderate non-obstructive CAD,  tobacco abuse and hiatal hernia here today for cardiac follow up. He was seen in the ED at [facility] on 04/22/11 with CP and ruled out for an MI.  Outpatient cath on 05/12/11 with moderate non-obstructive disease and normal LV function. His chest pain was felt to possibly be GI related. He has been seen by Dr. Arlyce Dice with GI and had a barium swallow which showed a small hiatal hernia. Upper endoscopy was normal but he did have a gastric emptying scan showed gastroparesis. He has not tolerated any of the GI meds. He tells me now that he is on an "old time remedy" per GI that is helping.    He is feeling well today. Occasional chest pains after meals. No exertional chest pains. He has been chopping wood without problems. He has mild SOB with exertion but this is chronic and unchanged.  He is smoking 1/2 ppd. He is trying to quit completely.      Past Medical History  Diagnosis Date  . Hiatal hernia   . Anxiety state, unspecified   . Hyperlipemia   . Depression   . GERD (gastroesophageal reflux disease)   . Hemorrhoids   . Arthritis   . Sleep apnea     Past Surgical History  Procedure Date  . Knee surgery     left  . Mouth surgery   . Colonoscopy     Current Outpatient Prescriptions  Medication Sig Dispense Refill  . ALPRAZolam (XANAX) 1 MG tablet Take one tab every 8 hours as needed for anxiety  25 tablet  0  . aspirin 81 MG tablet Take 81 mg by mouth daily.        . Oxymetazoline HCl (NASAL SPRAY NA) by Nasal route. As needed       . pantoprazole (PROTONIX) 40 MG tablet Take 40 mg by mouth daily.        . simvastatin (ZOCOR) 20 MG tablet Take 20 mg by mouth at bedtime.         Current Facility-Administered Medications  Medication Dose Route Frequency Provider Last Rate Last Dose  . 0.9 %  sodium chloride infusion  500 mL Intravenous Continuous Louis Meckel, MD        No Known  Allergies  History   Social History  . Marital Status: Married    Spouse Name: N/A    Number of Children: 2  . Years of Education: N/A   Occupational History  . [employer]     Social History Main Topics  . Smoking status: Current Everyday Smoker -- 0.8 packs/day  . Smokeless tobacco: Never Used  . Alcohol Use: No  . Drug Use: No  . Sexually Active: Not on file   Other Topics Concern  . Not on file   Social History Narrative  . No narrative on file    Family History  Problem Relation Age of Onset  . Colon cancer Neg Hx   . Stomach cancer Maternal Grandmother   . Colon polyps Father   . Ulcerative colitis Father     ?  . Diabetes Father   . Heart disease Father     Review of Systems:  As stated in the HPI and otherwise negative.   BP 116/70  Pulse 80  Ht 5\' 8"  (1.727 m)  Wt 161 lb 6.4 oz (73.211 kg)  BMI 24.54 kg/m2  Physical Examination: General: Well  developed, well nourished, NAD HEENT: OP clear, mucus membranes moist SKIN: warm, dry. No rashes. Neuro: No focal deficits Musculoskeletal: Muscle strength 5/5 all ext Psychiatric: Mood and affect normal Neck: No JVD, no carotid bruits, no thyromegaly, no lymphadenopathy. Lungs:Clear bilaterally, no wheezes, rhonci, crackles Cardiovascular: Regular rate and rhythm. No murmurs, gallops or rubs. Abdomen:Soft. Bowel sounds present. Non-tender.  Extremities: No lower extremity edema. Pulses are 2 + in the bilateral DP/PT.  Cardiac Cath 05/12/11:  1.  The left main coronary artery had no evidence of disease.    2. The left anterior descending was a moderate-sized vessel that        coursed to the apex.  The proximal vessel had mild calcification        with mild 20% plaque.  The midvessel had 20% plaque.  The midvessel        had one area with a tubular 40% stenosis.  None of these lesions        appeared to be flow-limiting.  There were too small to moderate-        sized diagonal branches that had mild plaque  disease.    3. The circumflex artery gave off 2 early small-caliber obtuse        marginal branches and a large bifurcating third obtuse marginal        branch.  There was mild plaque disease in this vessel.  The third        obtuse marginal branch had a 40% stenosis that did not appear to be        flow-limiting.    4. The right coronary artery was a small nondominant vessel with no        evidence of disease.    5. Left ventricular angiogram was performed in the RAO projection        showed normal left ventricular systolic function with ejection        fraction of 55-60%.

## 2011-11-27 NOTE — Assessment & Plan Note (Signed)
Stable. No chest pain. He has mild to moderate non-obsrtuctive CAD. Continue statin and ASA. Will check fasting lipids and LFTS in six months.

## 2011-11-27 NOTE — Patient Instructions (Signed)
Your physician wants you to follow-up in: 12 months.  You will receive a reminder letter in the mail two months in advance. If you don't receive a letter, please call our office to schedule the follow-up appointment.  Your physician recommends that you return for fasting lab work in: 6 months on day of appt with Dr. Clifton James  Your physician recommends that you continue on your current medications as directed. Please refer to the Current Medication list given to you today.

## 2011-11-27 NOTE — Assessment & Plan Note (Signed)
Smoking cessation encouraged. He wishes to stop.  

## 2012-05-21 ENCOUNTER — Other Ambulatory Visit: Payer: Self-pay | Admitting: Cardiovascular Disease

## 2012-05-21 DIAGNOSIS — I2510 Atherosclerotic heart disease of native coronary artery without angina pectoris: Secondary | ICD-10-CM

## 2012-05-21 NOTE — Telephone Encounter (Signed)
Out of pills 

## 2012-05-22 MED ORDER — SIMVASTATIN 20 MG PO TABS: 20.0000 mg | ORAL_TABLET | Freq: Every day | ORAL | Status: DC

## 2012-06-23 ENCOUNTER — Other Ambulatory Visit: Payer: Self-pay | Admitting: Gastroenterology

## 2012-06-23 MED ORDER — AMBULATORY NON FORMULARY MEDICATION: 10.0000 mg | Freq: Four times a day (QID) | Status: DC

## 2012-06-23 MED ORDER — PANTOPRAZOLE SODIUM 40 MG PO TBEC: 40.0000 mg | DELAYED_RELEASE_TABLET | Freq: Every day | ORAL | Status: AC

## 2012-06-23 NOTE — Telephone Encounter (Signed)
Called in prescription to [facility] cancelled rx at [facility] and at [facility] Patient states that [facility] make the Domperidone

## 2012-09-12 IMAGING — CR DG UGI W/ HIGH DENSITY W/KUB
1 series · 1 of 1 positions shown · non-contrast
Comparison: None

CLINICAL DATA: Dysphagia and chest pain.  Bloating.  Hiatal
hernia.

UPPER GI SERIES WITH KUB
TECHNIQUE: Routine upper GI series was performed with the
Fluoroscopy Time: 1.6 minutes

[view not recorded]
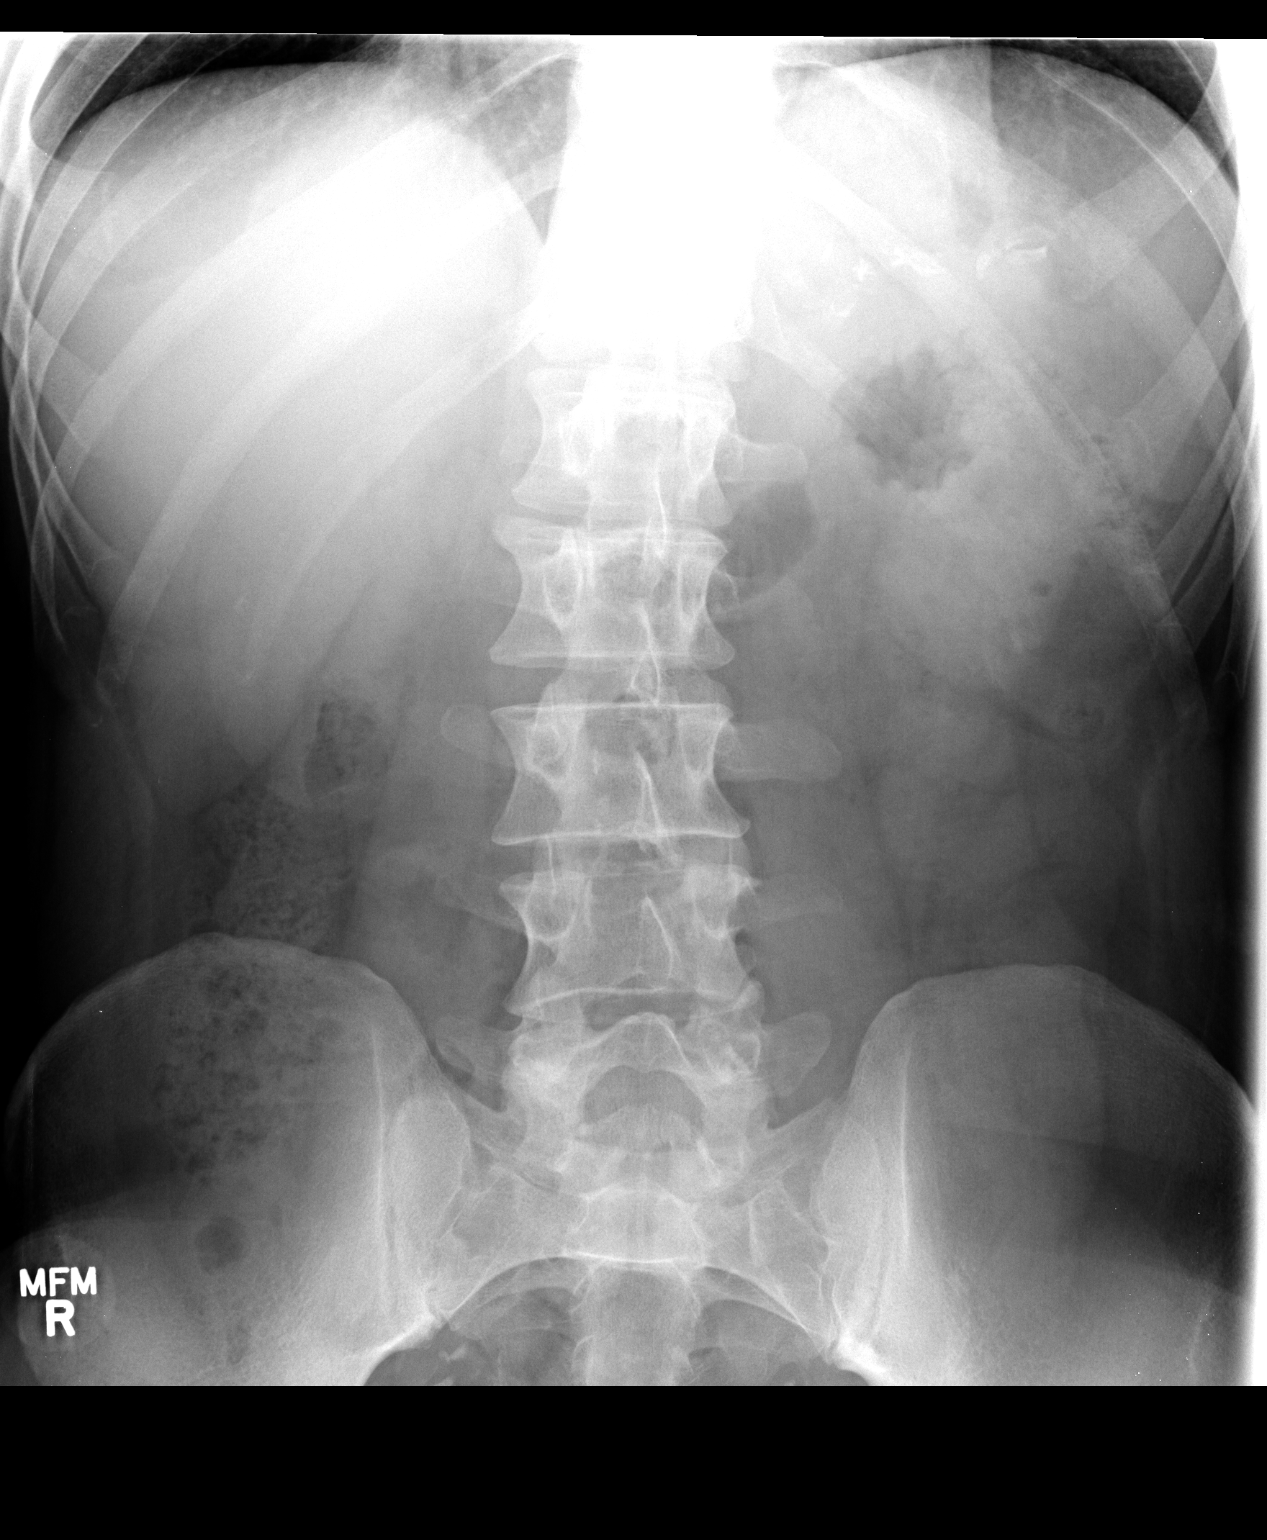

[1 of 1 positions shown; findings below may reference images not displayed]

FINDINGS: The KUB demonstrates vascular calcifications in the
abdomen and pelvis.  Bowel gas pattern is normal.

There is a small sliding hiatal hernia best demonstrated with the
patient in the prone oblique position and bearing down.  There is
no esophageal stricture or discrete esophagitis.  The fundus, body,
antrum of the stomach are normal.  The pylorus, duodenal bulb, and
C-loop are normal.
IMPRESSION: Small sliding hiatal hernia.  Otherwise normal exam.

## 2012-11-02 ENCOUNTER — Encounter: Payer: Self-pay | Admitting: Cardiovascular Disease

## 2012-11-02 ENCOUNTER — Ambulatory Visit (INDEPENDENT_AMBULATORY_CARE_PROVIDER_SITE_OTHER): Payer: Self-pay | Admitting: Cardiovascular Disease

## 2012-11-02 VITALS — BP 130/90 | HR 83 | Ht 67.0 in | Wt 172.0 lb

## 2012-11-02 DIAGNOSIS — Z720 Tobacco use: Secondary | ICD-10-CM

## 2012-11-02 DIAGNOSIS — I2510 Atherosclerotic heart disease of native coronary artery without angina pectoris: Secondary | ICD-10-CM

## 2012-11-02 DIAGNOSIS — F419 Anxiety disorder, unspecified: Secondary | ICD-10-CM

## 2012-11-02 DIAGNOSIS — F411 Generalized anxiety disorder: Secondary | ICD-10-CM

## 2012-11-02 DIAGNOSIS — F17200 Nicotine dependence, unspecified, uncomplicated: Secondary | ICD-10-CM

## 2012-11-02 MED ORDER — ALPRAZOLAM 0.5 MG PO TABS: ORAL_TABLET | ORAL | Status: DC

## 2012-11-02 MED ORDER — NITROGLYCERIN 0.4 MG SL SUBL: 0.4000 mg | SUBLINGUAL_TABLET | SUBLINGUAL | Status: AC | PRN

## 2012-11-02 NOTE — Progress Notes (Signed)
History of Present Illness: 51 yo WM with history of moderate non-obstructive CAD, tobacco abuse and hiatal hernia here today for cardiac follow up. He was seen in the ED at [facility] on 04/22/11 with CP and ruled out for an MI. Outpatient cath on 05/12/11 with moderate non-obstructive disease and normal LV function. His chest pain was felt to possibly be GI related. He has been seen by Dr. Arlyce Dice with GI and had a barium swallow which showed a small hiatal hernia. Upper endoscopy was normal but he did have a gastric emptying scan showed gastroparesis. He has not tolerated any of the GI meds. He tells me now that he is on an "old time remedy" per GI that is helping.   He is feeling well today. Occasional chest pains after meals. No exertional chest pains. He has mild SOB with exertion but this is chronic and unchanged. He is smoking 1/2 ppd. He is trying to quit completely. He is having episodes of anxiety which lead to SOB.   Primary Care Physician: None  Last Lipid Profile: No recent lipids  Past Medical History  Diagnosis Date  . Hiatal hernia   . Anxiety state, unspecified   . Hyperlipemia   . Depression   . GERD (gastroesophageal reflux disease)   . Hemorrhoids   . Arthritis   . Sleep apnea     Past Surgical History  Procedure Date  . Knee surgery     left  . Mouth surgery   . Colonoscopy     Current Outpatient Prescriptions  Medication Sig Dispense Refill  . AMBULATORY NON FORMULARY MEDICATION Take 10 mg by mouth 4 (four) times daily. Medication Name: Domperidone 10mg  take one by mouth 4 times daily 30 minutes before meals and at bedtime  120 capsule  4  . aspirin 81 MG tablet Take 81 mg by mouth daily.        . Oxymetazoline HCl (NASAL SPRAY NA) by Nasal route. As needed       . pantoprazole (PROTONIX) 40 MG tablet Take 1 tablet (40 mg total) by mouth daily.  30 tablet  11  . simvastatin (ZOCOR) 20 MG tablet Take 1 tablet (20 mg total) by mouth at bedtime.  90 tablet  3    Current Facility-Administered Medications  Medication Dose Route Frequency Provider Last Rate Last Dose  . 0.9 %  sodium chloride infusion  500 mL Intravenous Continuous Louis Meckel, MD        No Known Allergies  History   Social History  . Marital Status: Married    Spouse Name: N/A    Number of Children: 2  . Years of Education: N/A   Occupational History  . [employer] Man     Social History Main Topics  . Smoking status: Current Every Day Smoker -- 0.8 packs/day  . Smokeless tobacco: Never Used  . Alcohol Use: No  . Drug Use: No  . Sexually Active: Not on file   Other Topics Concern  . Not on file   Social History Narrative  . No narrative on file    Family History  Problem Relation Age of Onset  . Colon cancer Neg Hx   . Stomach cancer Maternal Grandmother   . Colon polyps Father   . Ulcerative colitis Father     ?  . Diabetes Father   . Heart disease Father     Review of Systems:  As stated in the HPI and otherwise negative.  BP 130/90  Pulse 83  Ht 5\' 7"  (1.702 m)  Wt 172 lb (78.019 kg)  BMI 26.94 kg/m2  Physical Examination: General: Well developed, well nourished, NAD HEENT: OP clear, mucus membranes moist SKIN: warm, dry. No rashes. Neuro: No focal deficits Musculoskeletal: Muscle strength 5/5 all ext Psychiatric: Mood and affect normal Neck: No JVD, no carotid bruits, no thyromegaly, no lymphadenopathy. Lungs:Clear bilaterally, no wheezes, rhonci, crackles Cardiovascular: Regular rate and rhythm. No murmurs, gallops or rubs. Abdomen:Soft. Bowel sounds present. Non-tender.  Extremities: No lower extremity edema. Pulses are 2 + in the bilateral DP/PT.  Cardiac Cath 05/12/11:  1. The left main coronary artery had no evidence of disease.  2. The left anterior descending was a moderate-sized vessel that  coursed to the apex. The proximal vessel had mild calcification  with mild 20% plaque. The midvessel had 20% plaque. The midvessel  had  one area with a tubular 40% stenosis. None of these lesions  appeared to be flow-limiting. There were too small to moderate-  sized diagonal branches that had mild plaque disease.  3. The circumflex artery gave off 2 early small-caliber obtuse  marginal branches and a large bifurcating third obtuse marginal  branch. There was mild plaque disease in this vessel. The third  obtuse marginal branch had a 40% stenosis that did not appear to be  flow-limiting.  4. The right coronary artery was a small nondominant vessel with no  evidence of disease.  5. Left ventricular angiogram was performed in the RAO projection  showed normal left ventricular systolic function with ejection  fraction of 55-60%.   EKG: NSR, rate 72 bpm. LVH.   Assessment and Plan:   1. CAD: Stable. No chest pain. He has mild to moderate non-obstructive CAD. Continue statin and ASA. Will check fasting lipids and LFTS today.   2. Tobacco abuse: Smoking cessation encouraged. He wishes to stop.   3. Anxiety: Will give Xanax to use prn.

## 2012-11-02 NOTE — Patient Instructions (Addendum)
Your physician wants you to follow-up in:  12 months. You will receive a reminder letter in the mail two months in advance. If you don't receive a letter, please call our office to schedule the follow-up appointment.  Your physician recommends that you return for fasting lab work  Later this week--Lipid and Liver profile  Your physician has recommended you make the following change in your medication:  May use Xanax 0.5 mg by mouth every 12 hours as needed for anxiety

## 2012-11-04 ENCOUNTER — Other Ambulatory Visit (INDEPENDENT_AMBULATORY_CARE_PROVIDER_SITE_OTHER): Payer: Self-pay

## 2012-11-04 DIAGNOSIS — I2510 Atherosclerotic heart disease of native coronary artery without angina pectoris: Secondary | ICD-10-CM

## 2012-11-04 LAB — HEPATIC FUNCTION PANEL
ALT: 18 U/L (ref 0–53)
AST: 17 U/L (ref 0–37)
Albumin: 4.3 g/dL (ref 3.5–5.2)
Alkaline Phosphatase: 41 U/L (ref 39–117)
Total Protein: 7.4 g/dL (ref 6.0–8.3)

## 2012-11-05 ENCOUNTER — Other Ambulatory Visit: Payer: Self-pay

## 2012-11-09 ENCOUNTER — Other Ambulatory Visit: Payer: Self-pay | Admitting: *Deleted

## 2012-11-09 ENCOUNTER — Telehealth: Payer: Self-pay | Admitting: *Deleted

## 2012-11-09 DIAGNOSIS — I2510 Atherosclerotic heart disease of native coronary artery without angina pectoris: Secondary | ICD-10-CM

## 2012-11-09 MED ORDER — SIMVASTATIN 40 MG PO TABS: 40.0000 mg | ORAL_TABLET | Freq: Every day | ORAL | Status: DC

## 2012-11-09 NOTE — Telephone Encounter (Signed)
Left message for pt to call back  °

## 2012-11-09 NOTE — Telephone Encounter (Signed)
Spoke with pt. He was told at last office visit with Dr.McAlhany to check blood pressure at home and call us with readings.      November 13-  7 AM--106/80-80                             6 PM--100/71-81    November 14--7:40 AM-114/81-81                             5 PM--119/81-80                             6:45 PM--112/81-71    November 15--7:45 AM-121/81-75                            6:45 PM-107/75-78                            8 PM---   107/79-86

## 2012-11-09 NOTE — Telephone Encounter (Signed)
Those look good. Thanks, chris

## 2012-11-12 NOTE — Telephone Encounter (Signed)
Spoke with pt. He will continue to monitor blood pressure at home and call us if it becomes elevated.

## 2012-12-20 ENCOUNTER — Telehealth: Payer: Self-pay | Admitting: Gastroenterology

## 2012-12-20 NOTE — Telephone Encounter (Signed)
Informed pt of Dr Marvell Fuller recommendations and he states he's tried all but the sugar and pressing on the eyeballs; he will try these and call me in am if no better.

## 2012-12-20 NOTE — Telephone Encounter (Signed)
Pt with hx of Gastroparesis and anxiety. He was unable to tolerate Reglan and is on Domperidone. Per notes he is to follow a Gastroparesis diet, but from our conversation, I don't know how compliant he is with the dies. Pt reports hiccups x 5 days and the hiccups cause heartburn and pain in his chest. The hiccups come and go, but he can get them from swallowing water.  I offered pt an appt on 12/24/11, but he wants to know if there's a med that will help. Please advise. Thanks.

## 2012-12-20 NOTE — Telephone Encounter (Signed)
He should try these in order before we try medication    Interrupt normal respiratory function (eg, breath holding, Valsalva maneuver)  Stimulate nasopharynx or uvula (eg, sipping cold water, gargling with water, swallowing a teaspoon of dry sugar)  Increase vagal stimulation (eg, pressing on the eyeballs)  Counteract irritation of the diaphragm (eg, pulling knees to chest, leaning forward to compress the chest)

## 2012-12-23 ENCOUNTER — Encounter (HOSPITAL_COMMUNITY): Payer: Self-pay

## 2012-12-23 ENCOUNTER — Emergency Department (HOSPITAL_COMMUNITY)
Admission: EM | Admit: 2012-12-23 | Discharge: 2012-12-23 | Disposition: A | Discharge status: Home / Self Care | Payer: Self-pay | Attending: Emergency Medicine | Admitting: Emergency Medicine

## 2012-12-23 ENCOUNTER — Emergency Department (HOSPITAL_COMMUNITY): Payer: Self-pay

## 2012-12-23 ENCOUNTER — Telehealth: Payer: Self-pay | Admitting: Gastroenterology

## 2012-12-23 DIAGNOSIS — R110 Nausea: Secondary | ICD-10-CM | POA: Insufficient documentation

## 2012-12-23 DIAGNOSIS — Z8739 Personal history of other diseases of the musculoskeletal system and connective tissue: Secondary | ICD-10-CM | POA: Insufficient documentation

## 2012-12-23 DIAGNOSIS — F17200 Nicotine dependence, unspecified, uncomplicated: Secondary | ICD-10-CM | POA: Insufficient documentation

## 2012-12-23 DIAGNOSIS — R066 Hiccough: Secondary | ICD-10-CM | POA: Insufficient documentation

## 2012-12-23 DIAGNOSIS — Z7982 Long term (current) use of aspirin: Secondary | ICD-10-CM | POA: Insufficient documentation

## 2012-12-23 DIAGNOSIS — Z8709 Personal history of other diseases of the respiratory system: Secondary | ICD-10-CM | POA: Insufficient documentation

## 2012-12-23 DIAGNOSIS — I2510 Atherosclerotic heart disease of native coronary artery without angina pectoris: Secondary | ICD-10-CM | POA: Insufficient documentation

## 2012-12-23 DIAGNOSIS — Z8659 Personal history of other mental and behavioral disorders: Secondary | ICD-10-CM | POA: Insufficient documentation

## 2012-12-23 DIAGNOSIS — E785 Hyperlipidemia, unspecified: Secondary | ICD-10-CM | POA: Insufficient documentation

## 2012-12-23 DIAGNOSIS — K219 Gastro-esophageal reflux disease without esophagitis: Secondary | ICD-10-CM | POA: Insufficient documentation

## 2012-12-23 DIAGNOSIS — Z79899 Other long term (current) drug therapy: Secondary | ICD-10-CM | POA: Insufficient documentation

## 2012-12-23 HISTORY — DX: Atherosclerotic heart disease of native coronary artery without angina pectoris: I25.10

## 2012-12-23 LAB — CBC WITH DIFFERENTIAL/PLATELET
Eosinophils Relative: 6 % — ABNORMAL HIGH (ref 0–5)
HCT: 40.1 % (ref 39.0–52.0)
Hemoglobin: 14.3 g/dL (ref 13.0–17.0)
Lymphocytes Relative: 51 % — ABNORMAL HIGH (ref 12–46)
Lymphs Abs: 4 10*3/uL (ref 0.7–4.0)
MCV: 84.4 fL (ref 78.0–100.0)
Monocytes Absolute: 0.5 10*3/uL (ref 0.1–1.0)
RBC: 4.75 MIL/uL (ref 4.22–5.81)
WBC: 7.9 10*3/uL (ref 4.0–10.5)

## 2012-12-23 LAB — COMPREHENSIVE METABOLIC PANEL
ALT: 14 U/L (ref 0–53)
CO2: 20 mEq/L (ref 19–32)
Calcium: 10 mg/dL (ref 8.4–10.5)
Chloride: 99 mEq/L (ref 96–112)
Creatinine, Ser: 0.84 mg/dL (ref 0.50–1.35)
GFR calc Af Amer: >90 mL/min (ref >90)
GFR calc non Af Amer: >90 mL/min (ref >90)
Glucose, Bld: 94 mg/dL (ref 70–99)
Sodium: 135 mEq/L (ref 135–145)
Total Bilirubin: 0.4 mg/dL (ref 0.3–1.2)

## 2012-12-23 MED ORDER — CHLORPROMAZINE HCL 25 MG PO TABS
25.0000 mg | ORAL_TABLET | Freq: Once | ORAL | Status: AC
  Administered 2012-12-23: 25 mg via ORAL
  Filled 2012-12-23: qty 1

## 2012-12-23 MED ORDER — BACLOFEN 20 MG PO TABS: 20.0000 mg | ORAL_TABLET | Freq: Three times a day (TID) | ORAL | Status: AC

## 2012-12-23 NOTE — ED Notes (Signed)
Pt denies dizziness and lightheadedness; pt denies nausea; pt states easier to breath; pt mentating appropriately.

## 2012-12-23 NOTE — ED Notes (Signed)
Dr. Glick at bedside.  

## 2012-12-23 NOTE — ED Notes (Signed)
Pt ambulatory leaving ED. Pt continues to have hiccups upon d/c but has been given prescription for medication and instructions on when to return to ED again. Pt has no further questions upon d/ teaching. Pt does not appear in acute distress upon d/c.

## 2012-12-23 NOTE — ED Notes (Addendum)
Pt states he feels a little lightheaded. Pt denies nausea currently. Pt denies numbness and tingling. Pt mentating appropriately.

## 2012-12-23 NOTE — ED Notes (Signed)
Pt became dizzy/lightheaded and short of breath walking to bathroom. Pt taken to bathroom via wheelchair. Pt back in room. Pt states dizzy currently in bed. Pt mentating appropriately.

## 2012-12-23 NOTE — ED Notes (Addendum)
Pt reports hiccups x8 days, pt sts "it takes my breath away." per spouse pt hiccups in his sleep. Pt reports hiatal hernia, having esophagus stretched 2 years ago. Pt reports burning pain to his esophagus and mid-sternum chest pain.

## 2012-12-23 NOTE — ED Notes (Signed)
Pt in X ray

## 2012-12-23 NOTE — ED Notes (Signed)
Remi Haggard, NP at bedside.

## 2012-12-23 NOTE — Telephone Encounter (Signed)
Pt states he tried all the recommendations that were given to him a few days ago and nothing has helped his hiccups. Dr. Leone Payor please see the note dated 12/20/12 and advise what pt can do for the hiccups.

## 2012-12-23 NOTE — ED Provider Notes (Addendum)
51 year old male has been having persistent hiccups for the last 8 days. He is never had problems with pickups 04 although he has had problems with a hiatus hernia. On exam, lungs are clear heart has regular rate and rhythm. He is noted to be hiccuping every several seconds. He was given a dose of chlorpromazine with no relief. He will be given a trial of lioresal. Of note, he has an allergy to metoclopramide.  Medical screening examination/treatment/procedure(s) were conducted as a shared visit with non-physician practitioner(s) and myself.  I personally evaluated the patient during the encounter   Dione Booze, MD 12/23/12 1951  Dione Booze, MD 12/23/12 [id number]

## 2012-12-24 NOTE — Telephone Encounter (Signed)
Pt was seen in the ER and was given the prescription for baclofen last night.

## 2012-12-24 NOTE — Telephone Encounter (Signed)
ER notes indicate he was prescribed baclofen which would be next step  Has he started it?

## 2013-01-06 NOTE — ED Provider Notes (Signed)
History     CSN: [id number]  Arrival date & time 12/23/12  1616   First MD Initiated Contact with Patient 12/23/12 1727      Chief Complaint  Patient presents with  . Hiccups    (Consider location/radiation/quality/duration/timing/severity/associated sxs/prior treatment) The history is provided by the patient and the spouse. No language interpreter was used.  52 yo male pmh hiatal hernia, anxiety, GERD, sleep apnea and CAD with hiccups x 8 days.  He has been to Dr Leone Payor with GI and tried many home remidies without relief.    Past Medical History  Diagnosis Date  . Hiatal hernia   . Anxiety state, unspecified   . Hyperlipemia   . Depression   . GERD (gastroesophageal reflux disease)   . Hemorrhoids   . Arthritis   . Sleep apnea   . Coronary artery disease     Past Surgical History  Procedure Date  . Knee surgery     left  . Mouth surgery   . Colonoscopy     Family History  Problem Relation Age of Onset  . Colon cancer Neg Hx   . Stomach cancer Maternal Grandmother   . Colon polyps Father   . Ulcerative colitis Father     ?  . Diabetes Father   . Heart disease Father     History  Substance Use Topics  . Smoking status: Current Every Day Smoker -- 0.8 packs/day  . Smokeless tobacco: Never Used  . Alcohol Use: No      Review of Systems  Constitutional: Negative.   HENT: Negative.   Eyes: Negative.   Respiratory: Negative.  Negative for shortness of breath.   Cardiovascular: Negative.  Negative for chest pain.  Gastrointestinal: Negative.        Hiccups x 8 days  Neurological: Negative.   Psychiatric/Behavioral: Negative.   All other systems reviewed and are negative.    Allergies  Reglan and Simvastatin  Home Medications   Current Outpatient Rx  Name  Route  Sig  Dispense  Refill  . ALPRAZOLAM 0.5 MG PO TABS   Oral   Take 0.5 mg by mouth 2 (two) times daily as needed. For anxiety         . ASPIRIN 81 MG PO TABS   Oral   Take 81 mg  by mouth daily.           Marland Kitchen NITROGLYCERIN 0.4 MG SL SUBL   Sublingual   Place 1 tablet (0.4 mg total) under the tongue every 5 (five) minutes as needed for chest pain.   25 tablet   6   . NASAL SPRAY NA   Nasal   Place 1-2 sprays into the nose daily as needed. For dry nasal passages         . PANTOPRAZOLE SODIUM 40 MG PO TBEC   Oral   Take 1 tablet (40 mg total) by mouth daily.   30 tablet   11   . PRESCRIPTION MEDICATION   Oral   Take 10 mg by mouth daily. "Domperidone 10 mg"         . BACLOFEN 20 MG PO TABS   Oral   Take 1 tablet (20 mg total) by mouth 3 (three) times daily.   20 each   0     BP 130/80  Pulse 65  Temp 97.4 F (36.3 C) (Oral)  Resp 14  SpO2 97%  Physical Exam  Nursing note and vitals reviewed. Constitutional: He is  oriented to person, place, and time. He appears well-developed and well-nourished.  HENT:  Head: Normocephalic.  Eyes: Conjunctivae normal and EOM are normal. Pupils are equal, round, and reactive to light.  Neck: Normal range of motion. Neck supple.  Cardiovascular: Normal rate.   Pulmonary/Chest: Effort normal.  Abdominal: Soft.       Hiccups every few seconds  Musculoskeletal: Normal range of motion.  Neurological: He is alert and oriented to person, place, and time.  Skin: Skin is warm and dry.  Psychiatric: He has a normal mood and affect.    ED Course  Procedures (including critical care time)  Labs Reviewed  CBC WITH DIFFERENTIAL - Abnormal; Notable for the following:    Neutrophils Relative 36 (*)     Lymphocytes Relative 51 (*)     Eosinophils Relative 6 (*)     All other components within normal limits  COMPREHENSIVE METABOLIC PANEL  POCT I-STAT TROPONIN I  LAB REPORT - SCANNED   No results found.   1. Hiccups       MDM  52 yo male with hiccups x 8 days. Consulted Dr. Leone Payor with GI for home remidies with no relief.  Thorazine given in the ER today with no relief.  Will try rx for baclofen.  He  will follow up with Dr. Leone Payor this week.  He understands to return to ER for worsening symptoms.  EKG and labs today unremarkable.  Chest x-ray shows no acute process reviewed by myself.  (does not tolerate reglan). He will follow up with Dr. Leone Payor.   Labs Reviewed  CBC WITH DIFFERENTIAL - Abnormal; Notable for the following:    Neutrophils Relative 36 (*)     Lymphocytes Relative 51 (*)     Eosinophils Relative 6 (*)     All other components within normal limits  COMPREHENSIVE METABOLIC PANEL  POCT I-STAT TROPONIN I  LAB REPORT - SCANNED    Date: 01/06/2013  Rate: 83  Rhythm: normal sinus rhythm  QRS Axis: normal  Intervals: normal  ST/T Wave abnormalities: normal  Conduction Disutrbances:none  Narrative Interpretation:   Old EKG Reviewed: unchanged         Remi Haggard, NP 01/06/13 1338  Remi Haggard, NP 01/06/13 1340

## 2013-02-01 ENCOUNTER — Other Ambulatory Visit: Payer: Self-pay

## 2013-02-05 ENCOUNTER — Other Ambulatory Visit: Payer: Self-pay

## 2013-10-27 ENCOUNTER — Other Ambulatory Visit: Payer: Self-pay

## 2021-02-08 ENCOUNTER — Emergency Department (HOSPITAL_BASED_OUTPATIENT_CLINIC_OR_DEPARTMENT_OTHER): Payer: Self-pay

## 2021-02-08 ENCOUNTER — Emergency Department (HOSPITAL_COMMUNITY): Payer: Self-pay

## 2021-02-08 ENCOUNTER — Encounter (HOSPITAL_BASED_OUTPATIENT_CLINIC_OR_DEPARTMENT_OTHER): Payer: Self-pay | Admitting: *Deleted

## 2021-02-08 ENCOUNTER — Other Ambulatory Visit: Payer: Self-pay

## 2021-02-08 ENCOUNTER — Emergency Department (HOSPITAL_COMMUNITY): Payer: Self-pay | Admitting: Certified Registered Nurse Anesthetist

## 2021-02-08 ENCOUNTER — Encounter (HOSPITAL_COMMUNITY)
Admission: EM | Disposition: A | Discharge status: Home / Self Care | Payer: Self-pay | Source: Home / Self Care | Attending: Emergency Medicine

## 2021-02-08 ENCOUNTER — Ambulatory Visit (HOSPITAL_BASED_OUTPATIENT_CLINIC_OR_DEPARTMENT_OTHER)
Admission: EM | Admit: 2021-02-08 | Discharge: 2021-02-08 | Disposition: A | Discharge status: Home / Self Care | Payer: Self-pay | Attending: Emergency Medicine | Admitting: Emergency Medicine

## 2021-02-08 DIAGNOSIS — Z7982 Long term (current) use of aspirin: Secondary | ICD-10-CM | POA: Insufficient documentation

## 2021-02-08 DIAGNOSIS — Z20822 Contact with and (suspected) exposure to covid-19: Secondary | ICD-10-CM | POA: Insufficient documentation

## 2021-02-08 DIAGNOSIS — Z23 Encounter for immunization: Secondary | ICD-10-CM | POA: Insufficient documentation

## 2021-02-08 DIAGNOSIS — S61311A Laceration without foreign body of left index finger with damage to nail, initial encounter: Secondary | ICD-10-CM

## 2021-02-08 DIAGNOSIS — Z79899 Other long term (current) drug therapy: Secondary | ICD-10-CM | POA: Insufficient documentation

## 2021-02-08 DIAGNOSIS — S62631B Displaced fracture of distal phalanx of left index finger, initial encounter for open fracture: Secondary | ICD-10-CM | POA: Insufficient documentation

## 2021-02-08 DIAGNOSIS — F17210 Nicotine dependence, cigarettes, uncomplicated: Secondary | ICD-10-CM | POA: Insufficient documentation

## 2021-02-08 DIAGNOSIS — I2510 Atherosclerotic heart disease of native coronary artery without angina pectoris: Secondary | ICD-10-CM | POA: Insufficient documentation

## 2021-02-08 DIAGNOSIS — Z888 Allergy status to other drugs, medicaments and biological substances status: Secondary | ICD-10-CM | POA: Insufficient documentation

## 2021-02-08 DIAGNOSIS — W312XXA Contact with powered woodworking and forming machines, initial encounter: Secondary | ICD-10-CM | POA: Insufficient documentation

## 2021-02-08 DIAGNOSIS — S61012A Laceration without foreign body of left thumb without damage to nail, initial encounter: Secondary | ICD-10-CM | POA: Insufficient documentation

## 2021-02-08 HISTORY — PX: I & D EXTREMITY: SHX5045

## 2021-02-08 HISTORY — PX: AMPUTATION FINGER: SHX6594

## 2021-02-08 LAB — POCT I-STAT, CHEM 8
BUN: 11 mg/dL (ref 6–20)
Calcium, Ion: 1.26 mmol/L (ref 1.15–1.40)
Chloride: 101 mmol/L (ref 98–111)
Creatinine, Ser: 0.8 mg/dL (ref 0.61–1.24)
Glucose, Bld: 102 mg/dL — ABNORMAL HIGH (ref 70–99)
HCT: 41 % (ref 39.0–52.0)
Hemoglobin: 13.9 g/dL (ref 13.0–17.0)
Potassium: 4.7 mmol/L (ref 3.5–5.1)
Sodium: 135 mmol/L (ref 135–145)
TCO2: 28 mmol/L (ref 22–32)

## 2021-02-08 LAB — RESP PANEL BY RT-PCR (FLU A&B, COVID) ARPGX2
Influenza A by PCR: NEGATIVE
Influenza B by PCR: NEGATIVE
SARS Coronavirus 2 by RT PCR: NEGATIVE

## 2021-02-08 SURGERY — IRRIGATION AND DEBRIDEMENT EXTREMITY
Anesthesia: General | Site: Hand | Laterality: Left

## 2021-02-08 MED ORDER — PROPOFOL 10 MG/ML IV BOLUS
INTRAVENOUS | Status: AC
  Filled 2021-02-08: qty 20

## 2021-02-08 MED ORDER — HYDROMORPHONE HCL 1 MG/ML IJ SOLN
0.2500 mg | INTRAMUSCULAR | Status: DC | PRN
Start: 2021-02-08 — End: 2021-02-09

## 2021-02-08 MED ORDER — HYDROCODONE-ACETAMINOPHEN 5-325 MG PO TABS: 1.0000 | ORAL_TABLET | ORAL | 0 refills | Status: AC | PRN

## 2021-02-08 MED ORDER — BUPIVACAINE HCL (PF) 0.25 % IJ SOLN
INTRAMUSCULAR | Status: DC | PRN
  Administered 2021-02-08: 10 mL

## 2021-02-08 MED ORDER — 0.9 % SODIUM CHLORIDE (POUR BTL) OPTIME
TOPICAL | Status: DC | PRN
  Administered 2021-02-08: 1000 mL

## 2021-02-08 MED ORDER — PROPOFOL 10 MG/ML IV BOLUS
INTRAVENOUS | Status: DC | PRN
  Administered 2021-02-08: 120 mg via INTRAVENOUS

## 2021-02-08 MED ORDER — MIDAZOLAM HCL 2 MG/2ML IJ SOLN
INTRAMUSCULAR | Status: AC
  Filled 2021-02-08: qty 2

## 2021-02-08 MED ORDER — DEXAMETHASONE SODIUM PHOSPHATE 10 MG/ML IJ SOLN
INTRAMUSCULAR | Status: DC | PRN
  Administered 2021-02-08: 10 mg via INTRAVENOUS

## 2021-02-08 MED ORDER — CEFAZOLIN SODIUM-DEXTROSE 2-4 GM/100ML-% IV SOLN
2.0000 g | Freq: Once | INTRAVENOUS | Status: AC
  Administered 2021-02-08: 2 g via INTRAVENOUS
  Filled 2021-02-08: qty 100

## 2021-02-08 MED ORDER — SODIUM CHLORIDE 0.9 % IR SOLN
Status: DC | PRN
  Administered 2021-02-08: 3000 mL

## 2021-02-08 MED ORDER — TETANUS-DIPHTH-ACELL PERTUSSIS 5-2.5-18.5 LF-MCG/0.5 IM SUSY
0.5000 mL | PREFILLED_SYRINGE | Freq: Once | INTRAMUSCULAR | Status: AC
  Administered 2021-02-08: 0.5 mL via INTRAMUSCULAR
  Filled 2021-02-08: qty 0.5

## 2021-02-08 MED ORDER — LACTATED RINGERS IV SOLN: INTRAVENOUS | Status: DC

## 2021-02-08 MED ORDER — LIDOCAINE 2% (20 MG/ML) 5 ML SYRINGE
INTRAMUSCULAR | Status: AC
  Filled 2021-02-08: qty 5

## 2021-02-08 MED ORDER — ONDANSETRON HCL 4 MG/2ML IJ SOLN
INTRAMUSCULAR | Status: AC
  Filled 2021-02-08: qty 2

## 2021-02-08 MED ORDER — OXYCODONE-ACETAMINOPHEN 5-325 MG PO TABS
1.0000 | ORAL_TABLET | Freq: Once | ORAL | Status: AC
  Administered 2021-02-08: 1 via ORAL
  Filled 2021-02-08: qty 1

## 2021-02-08 MED ORDER — MIDAZOLAM HCL 5 MG/5ML IJ SOLN
INTRAMUSCULAR | Status: DC | PRN
  Administered 2021-02-08: 2 mg via INTRAVENOUS

## 2021-02-08 MED ORDER — ONDANSETRON HCL 4 MG/2ML IJ SOLN
INTRAMUSCULAR | Status: DC | PRN
  Administered 2021-02-08: 4 mg via INTRAVENOUS

## 2021-02-08 MED ORDER — ROCURONIUM BROMIDE 10 MG/ML (PF) SYRINGE
PREFILLED_SYRINGE | INTRAVENOUS | Status: AC
  Filled 2021-02-08: qty 10

## 2021-02-08 MED ORDER — EPHEDRINE SULFATE-NACL 50-0.9 MG/10ML-% IV SOSY
PREFILLED_SYRINGE | INTRAVENOUS | Status: DC | PRN
  Administered 2021-02-08 (×2): 10 mg via INTRAVENOUS

## 2021-02-08 MED ORDER — BUPIVACAINE HCL (PF) 0.25 % IJ SOLN
INTRAMUSCULAR | Status: AC
  Filled 2021-02-08: qty 30

## 2021-02-08 MED ORDER — EPHEDRINE 5 MG/ML INJ
INTRAVENOUS | Status: AC
  Filled 2021-02-08: qty 10

## 2021-02-08 MED ORDER — SUCCINYLCHOLINE CHLORIDE 200 MG/10ML IV SOSY
PREFILLED_SYRINGE | INTRAVENOUS | Status: DC | PRN
  Administered 2021-02-08: 100 mg via INTRAVENOUS

## 2021-02-08 MED ORDER — LIDOCAINE 2% (20 MG/ML) 5 ML SYRINGE
INTRAMUSCULAR | Status: DC | PRN
  Administered 2021-02-08: 80 mg via INTRAVENOUS

## 2021-02-08 MED ORDER — DEXAMETHASONE SODIUM PHOSPHATE 10 MG/ML IJ SOLN
INTRAMUSCULAR | Status: AC
  Filled 2021-02-08: qty 1

## 2021-02-08 MED ORDER — ALBUTEROL SULFATE HFA 108 (90 BASE) MCG/ACT IN AERS
INHALATION_SPRAY | RESPIRATORY_TRACT | Status: DC | PRN
  Administered 2021-02-08 (×2): 8 via RESPIRATORY_TRACT

## 2021-02-08 MED ORDER — PHENYLEPHRINE 40 MCG/ML (10ML) SYRINGE FOR IV PUSH (FOR BLOOD PRESSURE SUPPORT)
PREFILLED_SYRINGE | INTRAVENOUS | Status: DC | PRN
  Administered 2021-02-08: 80 ug via INTRAVENOUS
  Administered 2021-02-08: 40 ug via INTRAVENOUS
  Administered 2021-02-08: 80 ug via INTRAVENOUS

## 2021-02-08 MED ORDER — CEPHALEXIN 500 MG PO CAPS: 500.0000 mg | ORAL_CAPSULE | Freq: Four times a day (QID) | ORAL | 0 refills | Status: AC

## 2021-02-08 MED ORDER — FENTANYL CITRATE (PF) 100 MCG/2ML IJ SOLN
INTRAMUSCULAR | Status: DC | PRN
  Administered 2021-02-08 (×2): 50 ug via INTRAVENOUS

## 2021-02-08 MED ORDER — ACETAMINOPHEN 10 MG/ML IV SOLN
INTRAVENOUS | Status: AC
  Filled 2021-02-08: qty 100

## 2021-02-08 MED ORDER — PHENYLEPHRINE 40 MCG/ML (10ML) SYRINGE FOR IV PUSH (FOR BLOOD PRESSURE SUPPORT)
PREFILLED_SYRINGE | INTRAVENOUS | Status: AC
  Filled 2021-02-08: qty 10

## 2021-02-08 MED ORDER — CEFAZOLIN SODIUM-DEXTROSE 1-4 GM/50ML-% IV SOLN
INTRAVENOUS | Status: DC | PRN
  Administered 2021-02-08: 1 g via INTRAVENOUS

## 2021-02-08 MED ORDER — ACETAMINOPHEN 10 MG/ML IV SOLN
INTRAVENOUS | Status: DC | PRN
  Administered 2021-02-08: 1000 mg via INTRAVENOUS

## 2021-02-08 MED ORDER — SUCCINYLCHOLINE CHLORIDE 200 MG/10ML IV SOSY
PREFILLED_SYRINGE | INTRAVENOUS | Status: AC
  Filled 2021-02-08: qty 10

## 2021-02-08 MED ORDER — FENTANYL CITRATE (PF) 250 MCG/5ML IJ SOLN
INTRAMUSCULAR | Status: AC
  Filled 2021-02-08: qty 5

## 2021-02-08 SURGICAL SUPPLY — 45 items
BNDG COHESIVE 1X5 TAN STRL LF (GAUZE/BANDAGES/DRESSINGS) ×3 IMPLANT
BNDG COHESIVE 2X5 TAN STRL LF (GAUZE/BANDAGES/DRESSINGS) ×3 IMPLANT
BNDG CONFORM 2 STRL LF (GAUZE/BANDAGES/DRESSINGS) IMPLANT
BNDG ELASTIC 4X5.8 VLCR STR LF (GAUZE/BANDAGES/DRESSINGS) ×3 IMPLANT
BNDG GAUZE ELAST 4 BULKY (GAUZE/BANDAGES/DRESSINGS) IMPLANT
BNDG STRETCH 4X75 STRL LF (GAUZE/BANDAGES/DRESSINGS) ×3 IMPLANT
CORD BIPOLAR FORCEPS 12FT (ELECTRODE) ×3 IMPLANT
COVER SURGICAL LIGHT HANDLE (MISCELLANEOUS) ×3 IMPLANT
COVER WAND RF STERILE (DRAPES) ×3 IMPLANT
CUFF TOURN SGL QUICK 18X4 (TOURNIQUET CUFF) ×3 IMPLANT
CUFF TOURN SGL QUICK 24 (TOURNIQUET CUFF)
CUFF TRNQT CYL 24X4X16.5-23 (TOURNIQUET CUFF) IMPLANT
DRSG ADAPTIC 3X8 NADH LF (GAUZE/BANDAGES/DRESSINGS) ×3 IMPLANT
GAUZE SPONGE 4X4 12PLY STRL (GAUZE/BANDAGES/DRESSINGS) ×3 IMPLANT
GAUZE XEROFORM 1X8 LF (GAUZE/BANDAGES/DRESSINGS) IMPLANT
GAUZE XEROFORM 5X9 LF (GAUZE/BANDAGES/DRESSINGS) ×3 IMPLANT
GLOVE BIOGEL M 8.0 STRL (GLOVE) ×3 IMPLANT
GLOVE SS BIOGEL STRL SZ 8 (GLOVE) ×2 IMPLANT
GLOVE SUPERSENSE BIOGEL SZ 8 (GLOVE) ×1
GOWN STRL REUS W/ TWL LRG LVL3 (GOWN DISPOSABLE) ×2 IMPLANT
GOWN STRL REUS W/ TWL XL LVL3 (GOWN DISPOSABLE) ×4 IMPLANT
GOWN STRL REUS W/TWL LRG LVL3 (GOWN DISPOSABLE) ×3
GOWN STRL REUS W/TWL XL LVL3 (GOWN DISPOSABLE) ×6
KIT BASIN OR (CUSTOM PROCEDURE TRAY) ×3 IMPLANT
KIT TURNOVER KIT B (KITS) ×3 IMPLANT
MANIFOLD NEPTUNE II (INSTRUMENTS) ×3 IMPLANT
NEEDLE HYPO 25GX1X1/2 BEV (NEEDLE) ×3 IMPLANT
NS IRRIG 1000ML POUR BTL (IV SOLUTION) ×3 IMPLANT
PACK ORTHO EXTREMITY (CUSTOM PROCEDURE TRAY) ×3 IMPLANT
PAD ARMBOARD 7.5X6 YLW CONV (MISCELLANEOUS) ×3 IMPLANT
PAD CAST 4YDX4 CTTN HI CHSV (CAST SUPPLIES) IMPLANT
PADDING CAST COTTON 4X4 STRL (CAST SUPPLIES)
SET CYSTO W/LG BORE CLAMP LF (SET/KITS/TRAYS/PACK) ×3 IMPLANT
SLING ARM FOAM STRAP LRG (SOFTGOODS) ×3 IMPLANT
SOL PREP POV-IOD 4OZ 10% (MISCELLANEOUS) ×6 IMPLANT
SPONGE LAP 4X18 RFD (DISPOSABLE) ×3 IMPLANT
SUT CHROMIC 4 0 PS 2 18 (SUTURE) ×3 IMPLANT
SUT CHROMIC 5 0 P 3 (SUTURE) ×3 IMPLANT
SWAB CULTURE ESWAB REG 1ML (MISCELLANEOUS) IMPLANT
SYR CONTROL 10ML LL (SYRINGE) ×3 IMPLANT
TOWEL GREEN STERILE (TOWEL DISPOSABLE) ×3 IMPLANT
TOWEL GREEN STERILE FF (TOWEL DISPOSABLE) ×3 IMPLANT
TUBE CONNECTING 12X1/4 (SUCTIONS) ×3 IMPLANT
WATER STERILE IRR 1000ML POUR (IV SOLUTION) ×3 IMPLANT
YANKAUER SUCT BULB TIP NO VENT (SUCTIONS) IMPLANT

## 2021-02-08 NOTE — Anesthesia Postprocedure Evaluation (Signed)
Anesthesia Post Note  Patient: [name]  Procedure(s) Performed: IRRIGATION AND DEBRIDEMENT THUMB, INDEX AND MIDDLE FINGER (Left Hand) REVISION AMPUTATION INDEX AND MIDDLE FINGERS (Left Finger)     Patient location during evaluation: PACU Anesthesia Type: General Level of consciousness: awake and alert Pain management: pain level controlled Vital Signs Assessment: post-procedure vital signs reviewed and stable Respiratory status: spontaneous breathing, nonlabored ventilation and respiratory function stable Cardiovascular status: blood pressure returned to baseline and stable Postop Assessment: no apparent nausea or vomiting Anesthetic complications: no   No complications documented.  Last Vitals:  Vitals:   02/08/21 2145 02/08/21 2200  BP: (!) 154/84 (!) 143/90  Pulse: 68 70  Resp: 13 (!) 21  Temp:  (!) 36.4 C  SpO2: 99% 98%    Last Pain:  Vitals:   02/08/21 2200  TempSrc:   PainSc: 0-No pain                 [name],W. EDMOND

## 2021-02-08 NOTE — ED Notes (Signed)
Attempted to call report to short stay and RN reports that she has not heard anything in regards to the Pt and surgery. RN notified that patient is headed their way by POV and this nurse just wanted to give report. RN reports that she will try to call back prior to 1900.

## 2021-02-08 NOTE — ED Provider Notes (Signed)
[facility] EMERGENCY DEPARTMENT Provider Note   CSN: [id number] Arrival date & time: 02/08/21  1508     History Chief Complaint  Patient presents with  . Laceration    [name] is a 60 y.o. male with h/o CAD, depression, GERD, and HLD who presents to the ED as a transfer from [facility] via POV for finger injury. Suffered tablesaw injury to L thumb, L index finger, and L middle finger earlier this evening. Initially seen at [facility] where plain films demonstrated open fracture of distal tuft of L index finger. Patient received 2g IV Ancef and tetanus updated. Subsequently transferred to [facility] ED for hand surgeon consultation. Upon arrival, pain well controlled and bleeding controlled. Right-handed.  The history is provided by the patient and medical records.  Hand Injury Location:  Finger Finger location:  L thumb, L index finger and L middle finger Injury: yes   Time since incident:  6 hours Mechanism of injury comment:  Tablesaw injury Pain details:    Quality:  Sharp and throbbing   Radiates to:  Does not radiate   Severity:  Moderate   Onset quality:  Sudden   Duration:  6 hours   Timing:  Constant   Progression:  Unchanged Handedness:  Right-handed Foreign body present:  No foreign bodies Tetanus status:  Up to date Prior injury to area:  No Relieved by:  Rest Worsened by:  Movement Ineffective treatments:  None tried Associated symptoms: decreased range of motion and swelling   Associated symptoms: no back pain, no fever and no tingling   Risk factors: no recent illness        Past Medical History:  Diagnosis Date  . Anxiety state, unspecified   . Arthritis   . Coronary artery disease   . Depression   . GERD (gastroesophageal reflux disease)   . Hemorrhoids   . Hiatal hernia   . Hyperlipemia   . Sleep apnea     Patient Active Problem List   Diagnosis Date Noted  . Tobacco abuse 11/27/2011  . Gastroparesis 07/08/2011  . CAD  (coronary artery disease) 05/27/2011  . Chest pain, unspecified 05/15/2011  . Anxiety 05/15/2011    Past Surgical History:  Procedure Laterality Date  . COLONOSCOPY    . KNEE SURGERY     left  . MOUTH SURGERY         Family History  Problem Relation Age of Onset  . Colon cancer Neg Hx   . Stomach cancer Maternal Grandmother   . Colon polyps Father   . Ulcerative colitis Father        ?  . Diabetes Father   . Heart disease Father     Social History   Tobacco Use  . Smoking status: Current Every Day Smoker    Packs/day: 0.80  . Smokeless tobacco: Never Used  Substance Use Topics  . Alcohol use: No  . Drug use: No    Home Medications Prior to Admission medications   Medication Sig Start Date End Date Taking? Authorizing Provider  cephALEXin (KEFLEX) 500 MG capsule Take 1 capsule (500 mg total) by mouth 4 (four) times daily for 10 days. 02/08/21 02/18/21 Yes Dominica Severin, MD  HYDROcodone-acetaminophen (NORCO) 5-325 MG tablet Take 1 tablet by mouth every 4 (four) hours as needed for moderate pain. 02/08/21  Yes Dominica Severin, MD  ALPRAZolam Prudy Feeler) 0.5 MG tablet Take 0.5 mg by mouth 2 (two) times daily as needed. For anxiety 11/02/12  Kathleene Hazel, MD  aspirin 81 MG tablet Take 81 mg by mouth daily.    [provider]  baclofen (LIORESAL) 20 MG tablet Take 1 tablet (20 mg total) by mouth 3 (three) times daily. 12/23/12   Jethro Bastos, NP  nitroGLYCERIN (NITROSTAT) 0.4 MG SL tablet Place 1 tablet (0.4 mg total) under the tongue every 5 (five) minutes as needed for chest pain. 11/02/12   Kathleene Hazel, MD  Oxymetazoline HCl (NASAL SPRAY NA) Place 1-2 sprays into the nose daily as needed. For dry nasal passages    [provider]  pantoprazole (PROTONIX) 40 MG tablet Take 1 tablet (40 mg total) by mouth daily. 06/23/12   Louis Meckel, MD  PRESCRIPTION MEDICATION Take 10 mg by mouth daily. "Domperidone 10 mg"    [provider]    Allergies    Reglan [metoclopramide] and Simvastatin  Review of Systems   Review of Systems  Constitutional: Negative for chills and fever.  HENT: Negative for ear pain and sore throat.   Eyes: Negative for pain and visual disturbance.  Respiratory: Negative for cough and shortness of breath.   Cardiovascular: Negative for chest pain and palpitations.  Gastrointestinal: Negative for abdominal pain and vomiting.  Genitourinary: Negative for dysuria and hematuria.  Musculoskeletal: Negative for arthralgias and back pain.  Skin: Positive for wound. Negative for color change and rash.  Neurological: Negative for seizures and syncope.  All other systems reviewed and are negative.   Physical Exam Updated Vital Signs BP (!) 143/90   Pulse 70   Temp (!) 97.5 F (36.4 C)   Resp (!) 21   Ht 5\' 7"  (1.702 m)   Wt 78 kg   SpO2 98%   BMI 26.93 kg/m   Physical Exam Vitals and nursing note reviewed.  Constitutional:      General: He is awake. He is not in acute distress.    Appearance: Normal appearance. He is well-developed, well-groomed and well-nourished. He is not ill-appearing.  HENT:     Head: Normocephalic and atraumatic.     Right Ear: External ear normal.     Left Ear: External ear normal.     Nose: Nose normal. No congestion or rhinorrhea.     Mouth/Throat:     Mouth: Mucous membranes are moist.     Pharynx: Oropharynx is clear. No oropharyngeal exudate or posterior oropharyngeal erythema.  Eyes:     General: No scleral icterus.       Right eye: No discharge.        Left eye: No discharge.     Conjunctiva/sclera: Conjunctivae normal.  Cardiovascular:     Rate and Rhythm: Normal rate and regular rhythm.  Pulmonary:     Effort: Pulmonary effort is normal. No respiratory distress.  Abdominal:     Palpations: Abdomen is soft.     Tenderness: There is no abdominal tenderness.  Musculoskeletal:        General: Signs of injury present. No edema.      Cervical back: Neck supple.     Comments: Complex laceration to L 2nd distal finger involving nailbed. Laceration to L distal 3rd finger. Superficial laceration to L thumb. Wounds hemostatic. ROM limited 2/2 pain. Remainder of hand NVI with preserved ROM. 2+ L radial pulse.  Skin:    General: Skin is warm and dry.     Findings: No rash.  Neurological:     General: No focal deficit present.  Mental Status: He is alert and oriented to person, place, and time.     Sensory: No sensory deficit.     Motor: No weakness.  Psychiatric:        Mood and Affect: Mood and affect and mood normal.        Behavior: Behavior normal. Behavior is cooperative.     ED Results / Procedures / Treatments   Labs (all labs ordered are listed, but only abnormal results are displayed) Labs Reviewed  POCT I-STAT, CHEM 8 - Abnormal; Notable for the following components:      Result Value   Glucose, Bld 102 (*)    All other components within normal limits  RESP PANEL BY RT-PCR (FLU A&B, COVID) [id number]    EKG None  Radiology DG Hand Complete Left  Result Date: 02/08/2021 CLINICAL DATA:  Laceration EXAM: LEFT HAND - COMPLETE 3+ VIEW COMPARISON:  None. FINDINGS: Comminuted fracture of the distal tuft of the left index finger distal phalanx with overlying soft tissue defect. A small portion of the bony substance appears absent suggesting traumatic amputation. No intra-articular fracture extension to the D IP joint. Mild soft tissue irregularity at the distal tip of the left long finger without underlying bony abnormality. Mild osteoarthritis of the left hand, most pronounced at the first [facility] joint. No radiopaque soft tissue foreign body. IMPRESSION: 1. Comminuted open fracture of the distal tuft of the left index finger distal phalanx with overlying soft tissue defect. A small portion of the bony substance appears absent suggesting traumatic amputation. 2. Soft tissue irregularity at the distal tip of the left long  finger without underlying bony abnormality. Electronically Signed   By: Duanne Guess D.O.   On: 02/08/2021 15:41   DG MINI C-ARM IMAGE ONLY  Result Date: 02/08/2021 There is no interpretation for this exam.  This order is for images obtained during a surgical procedure.  Please See "Surgeries" Tab for more information regarding the procedure.    Procedures Procedures  Medications Ordered in ED Medications  HYDROmorphone (DILAUDID) injection 0.25-0.5 mg (has no administration in time range)  lactated ringers infusion ( Intravenous Continued from Pre-op 02/08/21 2020)  oxyCODONE-acetaminophen (PERCOCET/ROXICET) 5-325 MG per tablet 1 tablet (1 tablet Oral Given 02/08/21 1639)  Tdap (BOOSTRIX) injection 0.5 mL (0.5 mLs Intramuscular Given 02/08/21 1659)  ceFAZolin (ANCEF) IVPB 2g/100 mL premix (0 g Intravenous Stopped 02/08/21 1753)    ED Course  I have reviewed the triage vital signs and the nursing notes.  Pertinent labs & imaging results that were available during my care of the patient were reviewed by me and considered in my medical decision making (see chart for details).    MDM Rules/Calculators/A&P                          Patient is a 59yoM with history and physical as described above who presents to the ED as a transfer from [facility] for L 1-3 digit injury and laceration. VS reassuring and HDS. Patient resting comfortably and in no acute distress. Dr. Amanda Pea promptly evaluated patient in ED shortly after arrival and took to OR for surgical repair of L hand lacerations. No acute events during ED course. Patient in stable condition at time of transfer to OR.  Final Clinical Impression(s) / ED Diagnoses Final diagnoses:  Laceration of left index finger without foreign body with damage to nail, initial encounter    Rx / DC Orders ED Discharge Orders  Ordered    cephALEXin (KEFLEX) 500 MG capsule  4 times daily        02/08/21 2124    HYDROcodone-acetaminophen (NORCO) 5-325  MG tablet  Every 4 hours PRN        02/08/21 2124    Call MD / Call 911       Comments: If you experience chest pain or shortness of breath, CALL 911 and be transported to the hospital emergency room.  If you develope a fever above 101 F, pus (white drainage) or increased drainage or redness at the wound, or calf pain, call your surgeon's office.   02/08/21 2124    Diet - low sodium heart healthy        02/08/21 2124    Constipation Prevention       Comments: Drink plenty of fluids.  Prune juice may be helpful.  You may use a stool softener, such as Colace (over the counter) 100 mg twice a day.  Use MiraLax (over the counter) for constipation as needed.   02/08/21 2124    Increase activity slowly as tolerated        02/08/21 2124           Tonia BroomsKeith, Tarius Stangelo, MD 02/09/21 Daiva [id number]    Pricilla LovelessGoldston, Scott, MD 02/09/21 [phone]

## 2021-02-08 NOTE — ED Notes (Signed)
Spoke to Software engineer at [employer] and continues to report that the OR and Short Stay does not know anything about this patient or the surgery.

## 2021-02-08 NOTE — Transfer of Care (Signed)
Immediate Anesthesia Transfer of Care Note  Patient: [name]  Procedure(s) Performed: IRRIGATION AND DEBRIDEMENT THUMB, INDEX AND MIDDLE FINGER (Left Hand) REVISION AMPUTATION INDEX AND MIDDLE FINGERS (Left Finger)  Patient Location: PACU  Anesthesia Type:General  Level of Consciousness: drowsy  Airway & Oxygen Therapy: Patient Spontanous Breathing and Patient connected to face mask oxygen  Post-op Assessment: Report given to RN and Post -op Vital signs reviewed and stable  Post vital signs: Reviewed and stable  Last Vitals:  Vitals Value Taken Time  BP 172/86 02/08/21 2126  Temp    Pulse 76 02/08/21 2127  Resp 15 02/08/21 2127  SpO2 99 % 02/08/21 2127  Vitals shown include unvalidated device data.  Last Pain:  Vitals:   02/08/21 1803  TempSrc: Oral  PainSc:          Complications: No complications documented.

## 2021-02-08 NOTE — ED Triage Notes (Signed)
Laceration to his left thumb, 2nd and 3rd digits while using a table saw. Bleeding controlled.

## 2021-02-08 NOTE — Anesthesia Procedure Notes (Signed)
Procedure Name: Intubation Date/Time: 02/08/2021 8:29 PM Performed by: Candis Shine, CRNA Pre-anesthesia Checklist: Patient identified, Emergency Drugs available, Suction available and Patient being monitored Patient Re-evaluated:Patient Re-evaluated prior to induction Oxygen Delivery Method: Circle System Utilized Preoxygenation: Pre-oxygenation with 100% oxygen Induction Type: IV induction, Rapid sequence and Cricoid Pressure applied Laryngoscope Size: Mac and 4 Grade View: Grade II Tube type: Oral Number of attempts: 1 Airway Equipment and Method: Stylet and Oral airway Placement Confirmation: ETT inserted through vocal cords under direct vision,  positive ETCO2 and breath sounds checked- equal and bilateral Tube secured with: Tape Dental Injury: Teeth and Oropharynx as per pre-operative assessment

## 2021-02-08 NOTE — ED Notes (Signed)
Fingers soaking in sterile water and betadine

## 2021-02-08 NOTE — Anesthesia Preprocedure Evaluation (Addendum)
Anesthesia Evaluation  Patient identified by MRN, date of birth, ID band Patient awake    Reviewed: Allergy & Precautions, H&P , NPO status , Patient's Chart, lab work & pertinent test results  Airway Mallampati: II  TM Distance: >3 FB Neck ROM: Full    Dental no notable dental hx. (+) Edentulous Upper, Edentulous Lower, Dental Advisory Given   Pulmonary sleep apnea , Current SmokerPatient did not abstain from smoking.,    Pulmonary exam normal breath sounds clear to auscultation       Cardiovascular + CAD   Rhythm:Regular Rate:Normal     Neuro/Psych Anxiety Depression negative neurological ROS     GI/Hepatic Neg liver ROS, hiatal hernia, GERD  ,  Endo/Other  negative endocrine ROS  Renal/GU negative Renal ROS  negative genitourinary   Musculoskeletal  (+) Arthritis , Osteoarthritis,    Abdominal   Peds  Hematology negative hematology ROS (+)   Anesthesia Other Findings   Reproductive/Obstetrics negative OB ROS                            Anesthesia Physical Anesthesia Plan  ASA: III and emergent  Anesthesia Plan: General   Post-op Pain Management:    Induction: Intravenous, Rapid sequence and Cricoid pressure planned  PONV Risk Score and Plan: 2 and Ondansetron, Dexamethasone and Midazolam  Airway Management Planned: Oral ETT  Additional Equipment:   Intra-op Plan:   Post-operative Plan: Extubation in OR  Informed Consent: I have reviewed the patients History and Physical, chart, labs and discussed the procedure including the risks, benefits and alternatives for the proposed anesthesia with the patient or authorized representative who has indicated his/her understanding and acceptance.     Dental advisory given  Plan Discussed with: CRNA  Anesthesia Plan Comments:         Anesthesia Quick Evaluation

## 2021-02-08 NOTE — H&P (Signed)
[name] is an 60 y.o. male.   Chief Complaint: Tablesaw injury left hand with disarray of the thumb index and middle finger. HPI: Patient presents the office for evaluation.  The patient has a history of tablesaw injury involving his thumb index and middle finger.  He has soft tissue disarray about these fingers on his left hand as well as significant pain.  The index finger has an open fracture as does the middle finger.  These injuries are quite severe.  I reviewed this with him at length and the findings.  I recommended operative reconstruction.  He has an IV site in his right arm he is stable he is awake alert and oriented.  Past Medical History:  Diagnosis Date  . Anxiety state, unspecified   . Arthritis   . Coronary artery disease   . Depression   . GERD (gastroesophageal reflux disease)   . Hemorrhoids   . Hiatal hernia   . Hyperlipemia   . Sleep apnea     Past Surgical History:  Procedure Laterality Date  . COLONOSCOPY    . KNEE SURGERY     left  . MOUTH SURGERY      Family History  Problem Relation Age of Onset  . Colon cancer Neg Hx   . Stomach cancer Maternal Grandmother   . Colon polyps Father   . Ulcerative colitis Father        ?  . Diabetes Father   . Heart disease Father    Social History:  reports that he has been smoking. He has been smoking about 0.80 packs per day. He has never used smokeless tobacco. He reports that he does not drink alcohol and does not use drugs.  Allergies:  Allergies  Allergen Reactions  . Reglan [Metoclopramide]   . Simvastatin     Any dose higher than 20mg     (Not in a hospital admission)   Results for orders placed or performed during the hospital encounter of 02/08/21 (from the past 48 hour(s))  Resp Panel by RT-PCR (Flu A&B, Covid) Nasopharyngeal Swab     Status: None   Collection Time: 02/08/21  5:47 PM   Specimen: Nasopharyngeal Swab; Nasopharyngeal(NP) swabs in vial transport medium  Result Value Ref Range    SARS Coronavirus 2 by RT PCR NEGATIVE NEGATIVE    Comment: (NOTE) SARS-CoV-2 target nucleic acids are NOT DETECTED.  The SARS-CoV-2 RNA is generally detectable in upper respiratory specimens during the acute phase of infection. The lowest concentration of SARS-CoV-2 viral copies this assay can detect is 138 copies/mL. A negative result does not preclude SARS-Cov-2 infection and should not be used as the sole basis for treatment or other patient management decisions. A negative result may occur with  improper specimen collection/handling, submission of specimen other than nasopharyngeal swab, presence of viral mutation(s) within the areas targeted by this assay, and inadequate number of viral copies(<138 copies/mL). A negative result must be combined with clinical observations, patient history, and epidemiological information. The expected result is Negative.  Fact Sheet for Patients:  02/10/21  Fact Sheet for Healthcare Providers:  BloggerCourse.com  This test is no t yet approved or cleared by the [place] FDA and  has been authorized for detection and/or diagnosis of SARS-CoV-2 by FDA under an Emergency Use Authorization (EUA). This EUA will remain  in effect (meaning this test can be used) for the duration of the COVID-19 declaration under Section 564(b)(1) of the Act, 21 U.S.C.section 360bbb-3(b)(1), unless the authorization  is terminated  or revoked sooner.       Influenza A by PCR NEGATIVE NEGATIVE   Influenza B by PCR NEGATIVE NEGATIVE    Comment: (NOTE) The Xpert Xpress SARS-CoV-2/FLU/RSV plus assay is intended as an aid in the diagnosis of influenza from Nasopharyngeal swab specimens and should not be used as a sole basis for treatment. Nasal washings and aspirates are unacceptable for Xpert Xpress SARS-CoV-2/FLU/RSV testing.  Fact Sheet for Patients: BloggerCourse.com  Fact  Sheet for Healthcare Providers: SeriousBroker.it  This test is not yet approved or cleared by the [place] FDA and has been authorized for detection and/or diagnosis of SARS-CoV-2 by FDA under an Emergency Use Authorization (EUA). This EUA will remain in effect (meaning this test can be used) for the duration of the COVID-19 declaration under Section 564(b)(1) of the Act, 21 U.S.C. section 360bbb-3(b)(1), unless the authorization is terminated or revoked.  Performed at [facility], [address] [place], [place] [address]    DG Hand Complete Left  Result Date: 02/08/2021 CLINICAL DATA:  Laceration EXAM: LEFT HAND - COMPLETE 3+ VIEW COMPARISON:  None. FINDINGS: Comminuted fracture of the distal tuft of the left index finger distal phalanx with overlying soft tissue defect. A small portion of the bony substance appears absent suggesting traumatic amputation. No intra-articular fracture extension to the D IP joint. Mild soft tissue irregularity at the distal tip of the left long finger without underlying bony abnormality. Mild osteoarthritis of the left hand, most pronounced at the first [facility] joint. No radiopaque soft tissue foreign body. IMPRESSION: 1. Comminuted open fracture of the distal tuft of the left index finger distal phalanx with overlying soft tissue defect. A small portion of the bony substance appears absent suggesting traumatic amputation. 2. Soft tissue irregularity at the distal tip of the left long finger without underlying bony abnormality. Electronically Signed   By: Duanne Guess D.O.   On: 02/08/2021 15:41    Review of Systems  Respiratory: Negative.   Cardiovascular: Negative.     Blood pressure (!) 144/98, pulse 60, temperature 97.7 F (36.5 C), temperature source Oral, resp. rate 17, height 5\' 7"  (1.702 m), weight 78 kg, SpO2 97 %. Physical Exam open fracture left index finger and middle finger as well as soft tissue  injury to the thumb status post tablesaw injury.  X-rays and findings have been reviewed.  I reviewed this with him at length.  The patient has significant disarray the soft tissue.  This patient will require irrigation debridement and repair reconstruction.  The patient is alert and oriented in no acute distress. The patient complains of pain in the affected upper extremity.  The patient is noted to have a normal HEENT exam. Lung fields show equal chest expansion and no shortness of breath. Abdomen exam is nontender without distention. Lower extremity examination does not show any fracture dislocation or blood clot symptoms. Pelvis is stable and the neck and back are stable and nontender.  Assessment/Plan We will plan for irrigation debridement thumb index and middle finger with repair reconstruction and revision amputation as outlined to he and his wife.  I spoke with his wife over the telephone and of course had a face-to-face discussion with the patient at great length.  We will plan to proceed accordingly as soon as possible.  All questions have been addressed.  We are planning surgery for your upper extremity. The risk and benefits of surgery to include risk of bleeding, infection, anesthesia,  damage to normal structures and failure of the surgery to accomplish its intended goals of relieving symptoms and restoring function have been discussed in detail. With this in mind we plan to proceed. I have specifically discussed with the patient the pre-and postoperative regime and the dos and don'ts and risk and benefits in great detail. Risk and benefits of surgery also include risk of dystrophy(CRPS), chronic nerve pain, failure of the healing process to go onto completion and other inherent risks of surgery The relavent the pathophysiology of the disease/injury process, as well as the alternatives for treatment and postoperative course of action has been discussed in great detail with the  patient who desires to proceed.  We will do everything in our power to help you (the patient) restore function to the upper extremity. It is a pleasure to see this patient today.   Oletta Cohn III, MD 02/08/2021, 7:42 PM

## 2021-02-08 NOTE — Op Note (Signed)
Operative note February 08, 2021  Dominica Severin MD  Diagnosis status post tablesaw injury with index and middle finger soft tissue injury including bone to the middle and index finger and nailbed injury  Procedure: #1 irrigation debridement left index finger skin subcutaneous tissue nailbed and bone #2 revision amputation left index finger with volar advancement flap #3 nailbed repair left index finger #4 irrigation and debridement skin subtenons tissue left middle finger #5 repair laceration left middle finger including nailbed repair #6 irrigation debridement skin subtenons tissue left thumb  Surgeon Dominica Severin  Anesthesia General  Tourniquet time less than an hour  Indications for the procedure this pleasant 60 year old male with a table saw injury who presents for evaluation and treatment.  He understands risk and benefits and desires to proceed.  Patient was taken to the operative theater underwent smooth induction of anesthesia following this prep and drape with Hibiclens scrub followed by Betadine scrub and paint was accomplished.  Outlined marks were made visually tourniquet insufflated and timeout had been observed.  Preoperative antibiotics were given.  Irrigation debridement of skin and subcutaneous tissue by thumb was complex excisional nature without difficulty.  Following this excisional debridement about the index finger was accomplished.  This included bone deep soft tissue and nailbed.  Following this the patient underwent bone sculpting and open treatment of the distal phalanx fracture followed by volar advancement flap with sculpting technique and nailbed repair with chromic suture of the 4-0 and 5-0 variety.  This was accomplished after 3 L of saline were placed through and through the wound.  Following this patient underwent irrigation debridement of skin subtenons tissue as well as nailbed tissue about the left middle finger.  Following this repair of the left  middle finger skin subcutaneous tissue and nailbed was accomplished with chromic suture.  Thus index finger underwent reconstructive efforts including open treatment of the bony fracture with volar advancement flap for revision amputation purposes and nailbed repair.  Middle finger underwent repair nailbed and soft tissue after irrigation and debridement and the thumb had a more superficial issue that just required the debridement and will healing with secondary intention healing.  I discussed with patient all issues plans and concerns.  I discussed with his wife Keflex antibiotic and pain management including Norco as needed.  We will see him back in the office in 6 to 7 days and advised him to be out of work and discussed the importance of elevation and proper postop care.  All questions have been addressed.  Chawn Spraggins MD

## 2021-02-08 NOTE — Discharge Instructions (Signed)

## 2021-02-08 NOTE — ED Notes (Signed)
Attempted to call report to Short Stay and RN reported that she did not know anything about this patient or procedure. She would call back. RN given contact number

## 2021-02-08 NOTE — ED Provider Notes (Signed)
[facility] EMERGENCY DEPARTMENT Provider Note   CSN: [id number] Arrival date & time: 02/08/21  1508     History Chief Complaint  Patient presents with  . Laceration    [name] is a 60 y.o. male with a past medical history significant for anxiety, CAD, depression, GERD, and hyperlipidemia who presents to the ED due to laceration to his 1st-3rd left fingers. Patient was using a table saw 2 hours prior to arrival when a piece of wood got stuck causing him to sustain numerous lacerations to his left 1st-3rd finger.  Admits to 9/10 pain, worse with movement.  Denies numbness/tingling.  No treatment prior to arrival.  Patient states he believes his last tetanus shot was roughly 8 years ago.  No other injuries.  Hemostasis achieved prior to arrival.  Patient is right-hand dominant.  History obtained from patient and past medical records. No interpreter used during encounter.      Past Medical History:  Diagnosis Date  . Anxiety state, unspecified   . Arthritis   . Coronary artery disease   . Depression   . GERD (gastroesophageal reflux disease)   . Hemorrhoids   . Hiatal hernia   . Hyperlipemia   . Sleep apnea     Patient Active Problem List   Diagnosis Date Noted  . Tobacco abuse 11/27/2011  . Gastroparesis 07/08/2011  . CAD (coronary artery disease) 05/27/2011  . Chest pain, unspecified 05/15/2011  . Anxiety 05/15/2011    Past Surgical History:  Procedure Laterality Date  . COLONOSCOPY    . KNEE SURGERY     left  . MOUTH SURGERY         Family History  Problem Relation Age of Onset  . Colon cancer Neg Hx   . Stomach cancer Maternal Grandmother   . Colon polyps Father   . Ulcerative colitis Father        ?  . Diabetes Father   . Heart disease Father     Social History   Tobacco Use  . Smoking status: Current Every Day Smoker    Packs/day: 0.80  . Smokeless tobacco: Never Used  Substance Use Topics  . Alcohol use: No  . Drug use: No     Home Medications Prior to Admission medications   Medication Sig Start Date End Date Taking? Authorizing Provider  ALPRAZolam Prudy Feeler) 0.5 MG tablet Take 0.5 mg by mouth 2 (two) times daily as needed. For anxiety 11/02/12   Kathleene Hazel, MD  aspirin 81 MG tablet Take 81 mg by mouth daily.    [provider]  baclofen (LIORESAL) 20 MG tablet Take 1 tablet (20 mg total) by mouth 3 (three) times daily. 12/23/12   Jethro Bastos, NP  nitroGLYCERIN (NITROSTAT) 0.4 MG SL tablet Place 1 tablet (0.4 mg total) under the tongue every 5 (five) minutes as needed for chest pain. 11/02/12   Kathleene Hazel, MD  Oxymetazoline HCl (NASAL SPRAY NA) Place 1-2 sprays into the nose daily as needed. For dry nasal passages    [provider]  pantoprazole (PROTONIX) 40 MG tablet Take 1 tablet (40 mg total) by mouth daily. 06/23/12   Louis Meckel, MD  PRESCRIPTION MEDICATION Take 10 mg by mouth daily. "Domperidone 10 mg"    [provider]    Allergies    Reglan [metoclopramide] and Simvastatin  Review of Systems   Review of Systems  Musculoskeletal: Positive for arthralgias.  Skin: Positive for wound.  Neurological:  Negative for numbness.  All other systems reviewed and are negative.   Physical Exam Updated Vital Signs BP (!) 160/90 (BP Location: Right Arm)   Pulse (!) 59   Temp 97.7 F (36.5 C) (Oral)   Resp 18   Ht 5\' 7"  (1.702 m)   Wt 78 kg   SpO2 99%   BMI 26.93 kg/m   Physical Exam Vitals and nursing note reviewed.  Constitutional:      General: He is not in acute distress.    Appearance: He is not ill-appearing.  HENT:     Head: Normocephalic.  Eyes:     Pupils: Pupils are equal, round, and reactive to light.  Cardiovascular:     Rate and Rhythm: Normal rate and regular rhythm.     Pulses: Normal pulses.     Heart sounds: Normal heart sounds. No murmur heard. No friction rub. No gallop.   Pulmonary:     Effort: Pulmonary effort  is normal.     Breath sounds: Normal breath sounds.  Abdominal:     General: Abdomen is flat. Bowel sounds are normal. There is no distension.     Palpations: Abdomen is soft.     Tenderness: There is no abdominal tenderness. There is no guarding or rebound.  Musculoskeletal:     Cervical back: Neck supple.     Comments: Limited ROM of 1st-3rd left fingers due to pain. Radial pulse intact. Full ROM of left wrist.   Skin:    Comments: Jagged laceration through left 2nd distal phalanx with nailbed injury. Laceration to tip of left 3rd finger. Small, superficial laceration to left 1st finger. See photos below.  Neurological:     General: No focal deficit present.     Mental Status: He is alert.  Psychiatric:        Mood and Affect: Mood normal.        Behavior: Behavior normal.               ED Results / Procedures / Treatments   Labs (all labs ordered are listed, but only abnormal results are displayed) Labs Reviewed  RESP PANEL BY RT-PCR (FLU A&B, COVID) [id number]    EKG None  Radiology DG Hand Complete Left  Result Date: 02/08/2021 CLINICAL DATA:  Laceration EXAM: LEFT HAND - COMPLETE 3+ VIEW COMPARISON:  None. FINDINGS: Comminuted fracture of the distal tuft of the left index finger distal phalanx with overlying soft tissue defect. A small portion of the bony substance appears absent suggesting traumatic amputation. No intra-articular fracture extension to the D IP joint. Mild soft tissue irregularity at the distal tip of the left long finger without underlying bony abnormality. Mild osteoarthritis of the left hand, most pronounced at the first [facility] joint. No radiopaque soft tissue foreign body. IMPRESSION: 1. Comminuted open fracture of the distal tuft of the left index finger distal phalanx with overlying soft tissue defect. A small portion of the bony substance appears absent suggesting traumatic amputation. 2. Soft tissue irregularity at the distal tip of the left long  finger without underlying bony abnormality. Electronically Signed   By: [facility] D.O[facility]   On: 02/08/2021 15:41    Procedures Procedures   Medications Ordered in ED Medications  oxyCODONE-acetaminophen (PERCOCET/ROXICET) 5-325 MG per tablet 1 tablet (1 tablet Oral Given 02/08/21 1639)  Tdap (BOOSTRIX) injection 0.5 mL (0.5 mLs Intramuscular Given 02/08/21 1659)  ceFAZolin (ANCEF) IVPB 2g/100 mL premix (0 g Intravenous Stopped 02/08/21 1753)    ED  Course  I have reviewed the triage vital signs and the nursing notes.  Pertinent labs & imaging results that were available during my care of the patient were reviewed by me and considered in my medical decision making (see chart for details).    MDM Rules/Calculators/A&P                         60 year old male presents to the ED due to lacerations to left distal portion of 1st- 3rd fingers after using a table saw just prior to arrival.  Patient is right-hand dominant.  Stable vitals.  Patient in no acute distress and non-ill-appearing.  Physical exam significant for jagged lacerations on 1st-3rd distal portion of left fingers. See photos above. Radial pulse intact.  Full range of motion of left wrist. Discussed case with Dr. Fredderick Phenix who agrees with assessment and plan. X-ray obtained at triage which I personally reviewed which demonstrates: IMPRESSION:  1. Comminuted open fracture of the distal tuft of the left index  finger distal phalanx with overlying soft tissue defect. A small  portion of the bony substance appears absent suggesting traumatic  amputation.  2. Soft tissue irregularity at the distal tip of the left long  finger without underlying bony abnormality.   Tetanus updated. IV ancef given. Wound soaked. Percocet given.  5:46 PM Discussed case with Dr. Amanda Pea with hand surgery who recommends patient be sent to [facility] for repair. COVID test ordered. Patient's wife will drive patient to [facility].  Advised patient to go  straight to [facility] with no stops in between.  Patient advised to stay n.p.o.  IV secured.  IV antibiotics stopped prior to completion to not prolong time to be seen by Dr. Amanda Pea per request.  Final Clinical Impression(s) / ED Diagnoses Final diagnoses:  Laceration of left index finger without foreign body with damage to nail, initial encounter    Rx / DC Orders ED Discharge Orders    None       Jesusita Oka 02/08/21 1804    Rolan Bucco, MD 02/08/21 2304

## 2021-02-09 ENCOUNTER — Encounter (HOSPITAL_COMMUNITY): Payer: Self-pay | Admitting: Orthopedic Surgery
